# Patient Record
Sex: Male | Born: 1940 | Marital: Married | Smoking: Never smoker
Health system: Southern US, Community
[De-identification: ages and names within clinical notes are randomized; demographics above are authoritative.]

## PROBLEM LIST (undated history)

## (undated) DIAGNOSIS — I482 Chronic atrial fibrillation, unspecified: Secondary | ICD-10-CM

## (undated) DIAGNOSIS — N400 Enlarged prostate without lower urinary tract symptoms: Secondary | ICD-10-CM

## (undated) DIAGNOSIS — I499 Cardiac arrhythmia, unspecified: Secondary | ICD-10-CM

## (undated) DIAGNOSIS — N289 Disorder of kidney and ureter, unspecified: Secondary | ICD-10-CM

## (undated) HISTORY — DX: Disorder of kidney and ureter, unspecified: N28.9

## (undated) HISTORY — DX: Benign prostatic hyperplasia without lower urinary tract symptoms: N40.0

## (undated) HISTORY — DX: Chronic atrial fibrillation, unspecified: I48.20

## (undated) HISTORY — PX: TONSILLECTOMY: SUR1361

## (undated) HISTORY — DX: Gilbert syndrome: E80.4

---

## 2015-12-19 DIAGNOSIS — I482 Chronic atrial fibrillation, unspecified: Secondary | ICD-10-CM | POA: Insufficient documentation

## 2015-12-19 DIAGNOSIS — R3912 Poor urinary stream: Secondary | ICD-10-CM | POA: Insufficient documentation

## 2015-12-19 DIAGNOSIS — E7800 Pure hypercholesterolemia, unspecified: Secondary | ICD-10-CM | POA: Insufficient documentation

## 2015-12-19 DIAGNOSIS — N401 Enlarged prostate with lower urinary tract symptoms: Secondary | ICD-10-CM | POA: Insufficient documentation

## 2016-06-17 DIAGNOSIS — Z0000 Encounter for general adult medical examination without abnormal findings: Secondary | ICD-10-CM | POA: Insufficient documentation

## 2016-06-17 DIAGNOSIS — Z7185 Encounter for immunization safety counseling: Secondary | ICD-10-CM | POA: Insufficient documentation

## 2018-01-14 DIAGNOSIS — E6609 Other obesity due to excess calories: Secondary | ICD-10-CM | POA: Insufficient documentation

## 2018-01-14 DIAGNOSIS — E66811 Obesity, class 1: Secondary | ICD-10-CM | POA: Insufficient documentation

## 2019-07-18 DIAGNOSIS — Z86010 Personal history of colonic polyps: Secondary | ICD-10-CM | POA: Insufficient documentation

## 2019-07-18 DIAGNOSIS — N289 Disorder of kidney and ureter, unspecified: Secondary | ICD-10-CM | POA: Insufficient documentation

## 2019-07-22 ENCOUNTER — Encounter: Payer: Self-pay | Admitting: Urology

## 2019-08-17 ENCOUNTER — Other Ambulatory Visit: Payer: Self-pay

## 2019-08-17 ENCOUNTER — Encounter: Payer: Self-pay | Admitting: Urology

## 2019-08-17 ENCOUNTER — Ambulatory Visit (INDEPENDENT_AMBULATORY_CARE_PROVIDER_SITE_OTHER): Payer: Medicare Other | Admitting: Urology

## 2019-08-17 VITALS — BP 132/83 | HR 92 | Ht 70.0 in | Wt 218.4 lb

## 2019-08-17 DIAGNOSIS — N401 Enlarged prostate with lower urinary tract symptoms: Secondary | ICD-10-CM | POA: Diagnosis not present

## 2019-08-17 DIAGNOSIS — R3912 Poor urinary stream: Secondary | ICD-10-CM | POA: Diagnosis not present

## 2019-08-17 LAB — BLADDER SCAN AMB NON-IMAGING

## 2019-08-17 NOTE — Patient Instructions (Signed)

## 2019-08-17 NOTE — Progress Notes (Signed)
   08/17/19 12:57 PM   Hunter Klein September 05, 1940 [id number]  CC: BPH  HPI: I saw Hunter Klein in urology clinic today for BPH.  Is a 79 year old male moved to the area a few years ago from [place].  He previously followed with a urologist yearly there and has been on finasteride long-term.  PSAs were reportedly always normal and screening was discontinued per AUA guideline recommendations.  His past medical history is notable for atrial fibrillation anticoagulated with Coumadin, congestive heart failure, and mild CKD.  He really denies any urinary complaints today aside from some occasional weak stream.  He denies any incontinence, urgency, or frequency.  He has nocturia 0-1 time per night.  IPSS score is 5, indicating mild symptoms with quality of life pleased.  PVR is normal at 0 mL.  He denies any flank pain or gross hematuria, no prior UTIs or episodes of urinary retention.   PMH: Past Medical History:  Diagnosis Date  . Atrial fibrillation, chronic ([place])   . BPH (benign prostatic hyperplasia)   . Gilbert syndrome   . Renal insufficiency     Social History:  reports that he does not use drugs. No history on file for tobacco and alcohol.  Physical Exam: BP 132/83 (BP Location: Left Arm, Patient Position: Sitting, Cuff Size: Normal)   Pulse 92   Ht 5\' 10"  (1.778 m)   Wt 218 lb 6.4 oz (99.1 kg)   BMI 31.34 kg/m    Constitutional:  Alert and oriented, No acute distress. Cardiovascular: No clubbing, cyanosis, or edema. Respiratory: Normal respiratory effort, no increased work of breathing. GI: Abdomen is soft, nontender, nondistended, no abdominal masses  Laboratory Data: Reviewed  Pertinent Imaging: None to review  Assessment & Plan:   In summary, is a 79 year old male with very mild urinary symptoms who has been on finasteride long-term for unclear indications.  He really has no urinary complaints today, and is emptying his bladder well with a PVR of 0 mL.  I  recommended considering a trial off the finasteride to see if his symptoms worsen.  He could always resume this medication in the future if he felt like his stream decreased.  He is amenable to this as he would like to take less medications if possible.  We discussed return precautions at length including gross hematuria, UTI, or worsening urinary symptoms.  We also reviewed the AUA guidelines that do not recommend routine screening in men over age 46.  Discontinue finasteride RTC 1 year for symptom check  Hunter Madrid, MD 08/17/2019  [facility] [address] [place], [place] [address] [phone]

## 2019-08-18 LAB — URINALYSIS, COMPLETE
Bilirubin, UA: NEGATIVE
Glucose, UA: NEGATIVE
Ketones, UA: NEGATIVE
Leukocytes,UA: NEGATIVE
Nitrite, UA: NEGATIVE
Protein,UA: NEGATIVE
Specific Gravity, UA: >1.03 — ABNORMAL HIGH (ref 1.005–1.030)
Urobilinogen, Ur: 0.2 mg/dL (ref 0.2–1.0)
pH, UA: 5.5 (ref 5.0–7.5)

## 2019-08-18 LAB — MICROSCOPIC EXAMINATION: Bacteria, UA: NONE SEEN

## 2020-01-09 ENCOUNTER — Other Ambulatory Visit: Payer: Self-pay

## 2020-01-09 ENCOUNTER — Other Ambulatory Visit
Admission: RE | Admit: 2020-01-09 | Discharge: 2020-01-09 | Disposition: A | Discharge status: Home / Self Care | Payer: Medicare Other | Source: Ambulatory Visit | Attending: Internal Medicine | Admitting: Internal Medicine

## 2020-01-09 DIAGNOSIS — Z01818 Encounter for other preprocedural examination: Secondary | ICD-10-CM | POA: Diagnosis present

## 2020-01-09 DIAGNOSIS — Z20822 Contact with and (suspected) exposure to covid-19: Secondary | ICD-10-CM | POA: Diagnosis not present

## 2020-01-09 LAB — SARS CORONAVIRUS 2 (TAT 6-24 HRS): SARS Coronavirus 2: NEGATIVE

## 2020-01-10 ENCOUNTER — Encounter: Payer: Self-pay | Admitting: Internal Medicine

## 2020-01-11 ENCOUNTER — Ambulatory Visit
Admission: RE | Admit: 2020-01-11 | Discharge: 2020-01-11 | Disposition: A | Discharge status: Home / Self Care | Payer: Medicare Other | Attending: Internal Medicine | Admitting: Internal Medicine

## 2020-01-11 ENCOUNTER — Encounter: Payer: Self-pay | Admitting: Internal Medicine

## 2020-01-11 ENCOUNTER — Encounter
Admission: RE | Disposition: A | Discharge status: Home / Self Care | Payer: Self-pay | Source: Home / Self Care | Attending: Internal Medicine

## 2020-01-11 ENCOUNTER — Ambulatory Visit: Payer: Medicare Other | Admitting: Anesthesiology

## 2020-01-11 DIAGNOSIS — D123 Benign neoplasm of transverse colon: Secondary | ICD-10-CM | POA: Diagnosis not present

## 2020-01-11 DIAGNOSIS — K641 Second degree hemorrhoids: Secondary | ICD-10-CM | POA: Insufficient documentation

## 2020-01-11 DIAGNOSIS — K5730 Diverticulosis of large intestine without perforation or abscess without bleeding: Secondary | ICD-10-CM | POA: Insufficient documentation

## 2020-01-11 DIAGNOSIS — Z86010 Personal history of colonic polyps: Secondary | ICD-10-CM | POA: Insufficient documentation

## 2020-01-11 DIAGNOSIS — I482 Chronic atrial fibrillation, unspecified: Secondary | ICD-10-CM | POA: Diagnosis not present

## 2020-01-11 DIAGNOSIS — Z79899 Other long term (current) drug therapy: Secondary | ICD-10-CM | POA: Insufficient documentation

## 2020-01-11 DIAGNOSIS — N289 Disorder of kidney and ureter, unspecified: Secondary | ICD-10-CM | POA: Diagnosis not present

## 2020-01-11 DIAGNOSIS — Z7901 Long term (current) use of anticoagulants: Secondary | ICD-10-CM | POA: Diagnosis not present

## 2020-01-11 DIAGNOSIS — D120 Benign neoplasm of cecum: Secondary | ICD-10-CM | POA: Diagnosis not present

## 2020-01-11 DIAGNOSIS — N400 Enlarged prostate without lower urinary tract symptoms: Secondary | ICD-10-CM | POA: Insufficient documentation

## 2020-01-11 DIAGNOSIS — Z1211 Encounter for screening for malignant neoplasm of colon: Secondary | ICD-10-CM | POA: Diagnosis not present

## 2020-01-11 HISTORY — DX: Cardiac arrhythmia, unspecified: I49.9

## 2020-01-11 HISTORY — PX: COLONOSCOPY: SHX5424

## 2020-01-11 SURGERY — COLONOSCOPY
Anesthesia: General

## 2020-01-11 MED ORDER — PHENYLEPHRINE HCL (PRESSORS) 10 MG/ML IV SOLN
INTRAVENOUS | Status: DC | PRN
  Administered 2020-01-11 (×5): 100 ug via INTRAVENOUS

## 2020-01-11 MED ORDER — PROPOFOL 500 MG/50ML IV EMUL
INTRAVENOUS | Status: DC | PRN
  Administered 2020-01-11: 180 ug/kg/min via INTRAVENOUS

## 2020-01-11 MED ORDER — SODIUM CHLORIDE 0.9 % IV SOLN: INTRAVENOUS | Status: DC

## 2020-01-11 MED ORDER — PROPOFOL 10 MG/ML IV BOLUS
INTRAVENOUS | Status: DC | PRN
  Administered 2020-01-11: 70 mg via INTRAVENOUS

## 2020-01-11 NOTE — Transfer of Care (Signed)
Immediate Anesthesia Transfer of Care Note  Patient: Hunter Klein  Procedure(s) Performed: COLONOSCOPY (N/A )  Patient Location: PACU  Anesthesia Type:General  Level of Consciousness: sedated  Airway & Oxygen Therapy: Patient Spontanous Breathing and Patient connected to nasal cannula oxygen  Post-op Assessment: Report given to RN and Post -op Vital signs reviewed and stable  Post vital signs: Reviewed and stable  Last Vitals:  Vitals Value Taken Time  BP 90/55 01/11/20 1420  Temp 36.6 C 01/11/20 1414  Pulse 82 01/11/20 1421  Resp 15 01/11/20 1421  SpO2 95 % 01/11/20 1421  Vitals shown include unvalidated device data.  Last Pain:  Vitals:   01/11/20 1414  TempSrc: Temporal  PainSc: Asleep         Complications: No complications documented.

## 2020-01-11 NOTE — H&P (Signed)
Outpatient short stay form Pre-procedure 01/11/2020 1:30 PM Hunter Klein K. Alice Reichert, M.D.  Primary Physician: Dion Body, M.D.  Reason for visit:  Personal history of tubular adenomatous colon polyps [place], [place] c. 2016).  History of present illness:                            Patient presents for colonoscopy for a personal hx of colon polyps. The patient denies abdominal pain, abnormal weight loss or rectal bleeding.  Patient takes coumadin for atrial fibrillation but has held that 5 or more days.    Current Facility-Administered Medications:  .  0.9 %  sodium chloride infusion, , Intravenous, Continuous, [place], Benay Pike, MD, Last Rate: 20 mL/hr at 01/11/20 1249, New Bag at 01/11/20 1249  Medications Prior to Admission  Medication Sig Dispense Refill Last Dose  . guaiFENesin (MUCINEX) 600 MG 12 hr tablet Take by mouth 2 (two) times daily.     Marland Kitchen lisinopril (ZESTRIL) 2.5 MG tablet Take 2.5 mg by mouth at bedtime.   01/10/2020 at Unknown time  . metoprolol succinate (TOPROL-XL) 50 MG 24 hr tablet Take 50 mg by mouth daily.   01/10/2020 at Unknown time  . latanoprost (XALATAN) 0.005 % ophthalmic solution Place 1 drop into the right eye at bedtime.     Marland Kitchen loratadine (CLARITIN) 10 MG tablet Take by mouth.     . simvastatin (ZOCOR) 40 MG tablet Take 40 mg by mouth at bedtime.     Marland Kitchen warfarin (COUMADIN) 2 MG tablet Take 3 mg by mouth daily.   12/30/2019     No Known Allergies   Past Medical History:  Diagnosis Date  . Atrial fibrillation, chronic ([employer] [employer])   . BPH (benign prostatic hyperplasia)   . Dysrhythmia    Afib  . Gilbert syndrome   . Renal insufficiency     Review of systems:  Otherwise negative.    Physical Exam  Gen: Alert, oriented. Appears stated age.  HEENT: [place]/AT. PERRLA. Lungs: CTA, no wheezes. CV: RR nl S1, S2. Abd: soft, benign, no masses. BS+ Ext: No edema. Pulses 2+    Planned procedures: Proceed with colonoscopy. The patient understands the nature of  the planned procedure, indications, risks, alternatives and potential complications including but not limited to bleeding, infection, perforation, damage to internal organs and possible oversedation/side effects from anesthesia. The patient agrees and gives consent to proceed.  Please refer to procedure notes for findings, recommendations and patient disposition/instructions.     Karas Pickerill K. Alice Reichert, M.D. Gastroenterology 01/11/2020  1:30 PM

## 2020-01-11 NOTE — Interval H&P Note (Signed)
History and Physical Interval Note:  01/11/2020 1:32 PM  Hunter Klein  has presented today for surgery, with the diagnosis of PERSONAL HX.OF COLON POLYPS.  The various methods of treatment have been discussed with the patient and family. After consideration of risks, benefits and other options for treatment, the patient has consented to  Procedure(s): COLONOSCOPY (N/A) as a surgical intervention.  The patient's history has been reviewed, patient examined, no change in status, stable for surgery.  I have reviewed the patient's chart and labs.  Questions were answered to the patient's satisfaction.     [place], [place]

## 2020-01-11 NOTE — Anesthesia Procedure Notes (Signed)
Date/Time: 01/11/2020 2:01 PM Performed by: Nelda Marseille, CRNA Pre-anesthesia Checklist: Patient identified, Emergency Drugs available, Suction available, Patient being monitored and Timeout performed Oxygen Delivery Method: Nasal cannula

## 2020-01-11 NOTE — Op Note (Addendum)
[facility] Gastroenterology Patient Name: Alan Drummer Procedure Date: 01/11/2020 1:09 PM MRN: [id number] Account #: [id number] Date of Birth: 11-11-1940 Admit Type: Outpatient Age: 79 Room: [facility] ENDO ROOM 3 Gender: Male Note Status: Finalized Procedure:             Colonoscopy Indications:           Surveillance: Personal history of adenomatous polyps                         on last colonoscopy > 5 years ago Providers:             Lorie Apley K. Alice Reichert MD, MD Referring MD:          Dion Body (Referring MD) Medicines:             Propofol per Anesthesia Complications:         No immediate complications. Procedure:             Pre-Anesthesia Assessment:                        - The risks and benefits of the procedure and the                         sedation options and risks were discussed with the                         patient. All questions were answered and informed                         consent was obtained.                        - Patient identification and proposed procedure were                         verified prior to the procedure by the nurse. The                         procedure was verified in the procedure room.                        - ASA Grade Assessment: III - A patient with severe                         systemic disease.                        - After reviewing the risks and benefits, the patient                         was deemed in satisfactory condition to undergo the                         procedure.                        After obtaining informed consent, the colonoscope was                         passed under direct vision. Throughout  the procedure,                         the patient's blood pressure, pulse, and oxygen                         saturations were monitored continuously. The                         Colonoscope was introduced through the anus and                         advanced to the the cecum, identified by  appendiceal                         orifice and ileocecal valve. The colonoscopy was                         performed without difficulty. The patient tolerated                         the procedure well. The quality of the bowel                         preparation was good. The ileocecal valve, appendiceal                         orifice, and rectum were photographed. Findings:      The perianal and digital rectal examinations were normal. Pertinent       negatives include normal sphincter tone and no palpable rectal lesions.      Many small-mouthed diverticula were found in the sigmoid colon.      Two sessile polyps were found in the transverse colon and cecum. The       polyps were 4 to 5 mm in size. These polyps were removed with a jumbo       cold forceps. Resection and retrieval were complete.      Non-bleeding internal hemorrhoids were found during retroflexion. The       hemorrhoids were Grade II (internal hemorrhoids that prolapse but reduce       spontaneously).      The exam was otherwise without abnormality. Impression:            - Diverticulosis in the sigmoid colon.                        - Two 4 to 5 mm polyps in the transverse colon and in                         the cecum, removed with a jumbo cold forceps. Resected                         and retrieved.                        - Non-bleeding internal hemorrhoids.                        - The examination was otherwise normal. Recommendation:        -  Patient has a contact number available for                         emergencies. The signs and symptoms of potential                         delayed complications were discussed with the patient.                         Return to normal activities tomorrow. Written                         discharge instructions were provided to the patient.                        - Resume previous diet.                        - Continue present medications.                        - Await  pathology results.                        - If polyps are benign or adenomatous without                         dysplasia, I will advise NO further colonoscopy due to                         advanced age and/or severe comorbidity.                        - Resume Coumadin (warfarin) at prior dose today.                         Refer to managing physician for further adjustment of                         therapy.                        - Return to GI office PRN.                        - The findings and recommendations were discussed with                         the patient. Procedure Code(s):     --- Professional ---                        [phone], Colonoscopy, flexible; with biopsy, single or                         multiple Diagnosis Code(s):     --- Professional ---                        K57.30, Diverticulosis of large intestine without  perforation or abscess without bleeding                        K63.5, Polyp of colon                        K64.1, Second degree hemorrhoids                        Z86.010, Personal history of colonic polyps CPT copyright 2019 American Medical Association. All rights reserved. The codes documented in this report are preliminary and upon coder review may  be revised to meet current compliance requirements. Efrain Sella MD, MD 01/11/2020 2:17:22 PM This report has been signed electronically. Number of Addenda: 0 Note Initiated On: 01/11/2020 1:09 PM Scope Withdrawal Time: 0 hours 5 minutes 33 seconds  Total Procedure Duration: 0 hours 12 minutes 8 seconds  Estimated Blood Loss:  Estimated blood loss: none.      [facility]

## 2020-01-11 NOTE — Anesthesia Preprocedure Evaluation (Signed)
Anesthesia Evaluation  Patient identified by MRN, date of birth, ID band Patient awake    Reviewed: Allergy & Precautions, NPO status , Patient's Chart, lab work & pertinent test results  History of Anesthesia Complications Negative for: history of anesthetic complications  Airway Mallampati: II       Dental  (+) Upper Dentures   Pulmonary neg sleep apnea, neg COPD, Not current smoker,           Cardiovascular (-) hypertension(-) Past MI and (-) CHF + dysrhythmias Atrial Fibrillation      Neuro/Psych neg Seizures    GI/Hepatic Neg liver ROS, neg GERD  ,  Endo/Other  neg diabetes  Renal/GU Renal InsufficiencyRenal disease     Musculoskeletal   Abdominal   Peds  Hematology   Anesthesia Other Findings   Reproductive/Obstetrics                             Anesthesia Physical Anesthesia Plan  ASA: III  Anesthesia Plan: General   Post-op Pain Management:    Induction: Intravenous  PONV Risk Score and Plan: 2 and Propofol infusion and TIVA  Airway Management Planned: Nasal Cannula  Additional Equipment:   Intra-op Plan:   Post-operative Plan:   Informed Consent: I have reviewed the patients History and Physical, chart, labs and discussed the procedure including the risks, benefits and alternatives for the proposed anesthesia with the patient or authorized representative who has indicated his/her understanding and acceptance.       Plan Discussed with:   Anesthesia Plan Comments:         Anesthesia Quick Evaluation

## 2020-01-11 NOTE — Anesthesia Postprocedure Evaluation (Signed)
Anesthesia Post Note  Patient: Hunter Klein  Procedure(s) Performed: COLONOSCOPY (N/A )  Patient location during evaluation: Endoscopy Anesthesia Type: General Level of consciousness: awake and alert Pain management: pain level controlled Vital Signs Assessment: post-procedure vital signs reviewed and stable Respiratory status: spontaneous breathing and respiratory function stable Cardiovascular status: stable Anesthetic complications: no   No complications documented.   Last Vitals:  Vitals:   01/11/20 1414 01/11/20 1419  BP: (!) 92/49 (!) 90/55  Pulse: 78 82  Resp: 15 15  Temp: 36.6 C   SpO2: 100% 96%    Last Pain:  Vitals:   01/11/20 1414  TempSrc: Temporal  PainSc: Asleep                 Robert Sunga K

## 2020-01-12 ENCOUNTER — Encounter: Payer: Self-pay | Admitting: Internal Medicine

## 2020-01-13 LAB — SURGICAL PATHOLOGY

## 2020-01-23 DIAGNOSIS — Z87448 Personal history of other diseases of urinary system: Secondary | ICD-10-CM | POA: Insufficient documentation

## 2020-08-17 ENCOUNTER — Ambulatory Visit: Payer: Self-pay | Admitting: Urology

## 2020-08-22 ENCOUNTER — Ambulatory Visit: Payer: Medicare Other | Admitting: Urology

## 2020-09-06 ENCOUNTER — Ambulatory Visit (INDEPENDENT_AMBULATORY_CARE_PROVIDER_SITE_OTHER): Payer: Medicare Other | Admitting: Urology

## 2020-09-06 ENCOUNTER — Other Ambulatory Visit: Payer: Self-pay

## 2020-09-06 ENCOUNTER — Encounter: Payer: Self-pay | Admitting: Urology

## 2020-09-06 VITALS — BP 125/76 | HR 80 | Ht 69.5 in | Wt 218.0 lb

## 2020-09-06 DIAGNOSIS — R3912 Poor urinary stream: Secondary | ICD-10-CM

## 2020-09-06 DIAGNOSIS — N401 Enlarged prostate with lower urinary tract symptoms: Secondary | ICD-10-CM | POA: Diagnosis not present

## 2020-09-06 LAB — URINALYSIS, COMPLETE
Bilirubin, UA: NEGATIVE
Glucose, UA: NEGATIVE
Ketones, UA: NEGATIVE
Leukocytes,UA: NEGATIVE
Nitrite, UA: NEGATIVE
Protein,UA: NEGATIVE
Specific Gravity, UA: 1.025 (ref 1.005–1.030)
Urobilinogen, Ur: 0.2 mg/dL (ref 0.2–1.0)
pH, UA: 5 (ref 5.0–7.5)

## 2020-09-06 LAB — BLADDER SCAN AMB NON-IMAGING

## 2020-09-06 LAB — MICROSCOPIC EXAMINATION: Bacteria, UA: NONE SEEN

## 2020-09-06 NOTE — Progress Notes (Signed)
   09/06/2020 8:55 AM   Hunter Klein Jan 08, 1941 [id number]  Reason for visit: Follow up BPH, discuss PSA screening  HPI: 79 year old male who I originally saw in May 2021 after he moved to the area from [place].  Was previously followed by a urologist yearly there and PSAs were reportedly normal and PSA screening was discontinued per the AUA guideline recommendations.  He has been on finasteride long-term for unclear reasons, and he was having minimal to no urinary symptoms at our last visit and we opted to stop the finasteride and see how his urinary symptoms responded.  He has done very well since that time with no changes in his urinary symptoms.  He denies any urinary complaints, gross hematuria, or UTIs.  I think he can follow-up on an as-needed basis.  We discussed return precautions including worsening urinary symptoms, gross hematuria, urinary retention, or UTIs.  We also again reviewed the AUA guidelines that do not recommend routine screening in men over age 77.  Follow-up with urology as needed   [place], [place] [address], [place], [place] [address] [phone]

## 2021-01-21 ENCOUNTER — Observation Stay
Admission: EM | Admit: 2021-01-21 | Discharge: 2021-01-22 | Disposition: A | Discharge status: Home / Self Care | Payer: Medicare Other | Attending: Internal Medicine | Admitting: Internal Medicine

## 2021-01-21 ENCOUNTER — Emergency Department: Payer: Medicare Other

## 2021-01-21 DIAGNOSIS — R791 Abnormal coagulation profile: Secondary | ICD-10-CM

## 2021-01-21 DIAGNOSIS — Z6831 Body mass index (BMI) 31.0-31.9, adult: Secondary | ICD-10-CM | POA: Diagnosis not present

## 2021-01-21 DIAGNOSIS — R739 Hyperglycemia, unspecified: Secondary | ICD-10-CM | POA: Diagnosis not present

## 2021-01-21 DIAGNOSIS — R4789 Other speech disturbances: Secondary | ICD-10-CM | POA: Diagnosis present

## 2021-01-21 DIAGNOSIS — I639 Cerebral infarction, unspecified: Secondary | ICD-10-CM | POA: Diagnosis not present

## 2021-01-21 DIAGNOSIS — E785 Hyperlipidemia, unspecified: Secondary | ICD-10-CM | POA: Diagnosis not present

## 2021-01-21 DIAGNOSIS — M6281 Muscle weakness (generalized): Secondary | ICD-10-CM | POA: Diagnosis not present

## 2021-01-21 DIAGNOSIS — N401 Enlarged prostate with lower urinary tract symptoms: Secondary | ICD-10-CM | POA: Insufficient documentation

## 2021-01-21 DIAGNOSIS — I482 Chronic atrial fibrillation, unspecified: Secondary | ICD-10-CM | POA: Diagnosis not present

## 2021-01-21 DIAGNOSIS — Z20822 Contact with and (suspected) exposure to covid-19: Secondary | ICD-10-CM | POA: Insufficient documentation

## 2021-01-21 DIAGNOSIS — Z7901 Long term (current) use of anticoagulants: Secondary | ICD-10-CM | POA: Diagnosis not present

## 2021-01-21 DIAGNOSIS — N182 Chronic kidney disease, stage 2 (mild): Secondary | ICD-10-CM | POA: Diagnosis not present

## 2021-01-21 DIAGNOSIS — I63512 Cerebral infarction due to unspecified occlusion or stenosis of left middle cerebral artery: Secondary | ICD-10-CM | POA: Diagnosis not present

## 2021-01-21 DIAGNOSIS — I6782 Cerebral ischemia: Secondary | ICD-10-CM | POA: Diagnosis not present

## 2021-01-21 DIAGNOSIS — R29810 Facial weakness: Secondary | ICD-10-CM | POA: Diagnosis present

## 2021-01-21 DIAGNOSIS — E669 Obesity, unspecified: Secondary | ICD-10-CM | POA: Insufficient documentation

## 2021-01-21 LAB — COMPREHENSIVE METABOLIC PANEL
ALT: 14 U/L (ref 0–44)
AST: 22 U/L (ref 15–41)
Albumin: 4.2 g/dL (ref 3.5–5.0)
Alkaline Phosphatase: 79 U/L (ref 38–126)
Anion gap: 6 (ref 5–15)
BUN: 23 mg/dL (ref 8–23)
CO2: 29 mmol/L (ref 22–32)
Calcium: 9.1 mg/dL (ref 8.9–10.3)
Chloride: 106 mmol/L (ref 98–111)
Creatinine, Ser: 1.04 mg/dL (ref 0.61–1.24)
GFR, Estimated: >60 mL/min (ref >60)
Glucose, Bld: 109 mg/dL — ABNORMAL HIGH (ref 70–99)
Potassium: 4.2 mmol/L (ref 3.5–5.1)
Sodium: 141 mmol/L (ref 135–145)
Total Bilirubin: 2.4 mg/dL — ABNORMAL HIGH (ref 0.3–1.2)
Total Protein: 7.3 g/dL (ref 6.5–8.1)

## 2021-01-21 LAB — CBC
HCT: 48.4 % (ref 39.0–52.0)
Hemoglobin: 16.4 g/dL (ref 13.0–17.0)
MCH: 32.9 pg (ref 26.0–34.0)
MCHC: 33.9 g/dL (ref 30.0–36.0)
MCV: 97 fL (ref 80.0–100.0)
Platelets: 182 10*3/uL (ref 150–400)
RBC: 4.99 MIL/uL (ref 4.22–5.81)
RDW: 13.1 % (ref 11.5–15.5)
WBC: 6.9 10*3/uL (ref 4.0–10.5)
nRBC: 0 % (ref 0.0–0.2)

## 2021-01-21 LAB — DIFFERENTIAL
Abs Immature Granulocytes: 0.02 10*3/uL (ref 0.00–0.07)
Basophils Absolute: 0 10*3/uL (ref 0.0–0.1)
Basophils Relative: 0 %
Eosinophils Absolute: 0.1 10*3/uL (ref 0.0–0.5)
Eosinophils Relative: 1 %
Immature Granulocytes: 0 %
Lymphocytes Relative: 21 %
Lymphs Abs: 1.4 10*3/uL (ref 0.7–4.0)
Monocytes Absolute: 0.5 10*3/uL (ref 0.1–1.0)
Monocytes Relative: 7 %
Neutro Abs: 4.9 10*3/uL (ref 1.7–7.7)
Neutrophils Relative %: 71 %

## 2021-01-21 LAB — PROTIME-INR
INR: 1.5 — ABNORMAL HIGH (ref 0.8–1.2)
Prothrombin Time: 17.8 seconds — ABNORMAL HIGH (ref 11.4–15.2)

## 2021-01-21 LAB — CBG MONITORING, ED: Glucose-Capillary: 136 mg/dL — ABNORMAL HIGH (ref 70–99)

## 2021-01-21 LAB — APTT: aPTT: 32 seconds (ref 24–36)

## 2021-01-21 MED ORDER — ACETAMINOPHEN 160 MG/5ML PO SOLN
650.0000 mg | ORAL | Status: DC | PRN
  Filled 2021-01-21: qty 20.3

## 2021-01-21 MED ORDER — WARFARIN - PHARMACIST DOSING INPATIENT
Freq: Every day | Status: DC
  Filled 2021-01-21: qty 1

## 2021-01-21 MED ORDER — ACETAMINOPHEN 650 MG RE SUPP: 650.0000 mg | RECTAL | Status: DC | PRN

## 2021-01-21 MED ORDER — WARFARIN SODIUM 5 MG PO TABS
5.0000 mg | ORAL_TABLET | Freq: Once | ORAL | Status: DC
  Filled 2021-01-21: qty 1

## 2021-01-21 MED ORDER — ACETAMINOPHEN 325 MG PO TABS: 650.0000 mg | ORAL_TABLET | Freq: Four times a day (QID) | ORAL | Status: DC | PRN

## 2021-01-21 MED ORDER — ACETAMINOPHEN 325 MG PO TABS: 650.0000 mg | ORAL_TABLET | ORAL | Status: DC | PRN

## 2021-01-21 MED ORDER — WARFARIN SODIUM 3 MG PO TABS: 3.0000 mg | ORAL_TABLET | Freq: Every day | ORAL | Status: DC

## 2021-01-21 MED ORDER — LORATADINE 10 MG PO TABS
10.0000 mg | ORAL_TABLET | Freq: Every day | ORAL | Status: DC
  Administered 2021-01-22: 12:00:00 10 mg via ORAL
  Filled 2021-01-21: qty 1

## 2021-01-21 MED ORDER — SODIUM CHLORIDE 0.9% FLUSH
3.0000 mL | Freq: Once | INTRAVENOUS | Status: AC
Start: 2021-01-21 — End: 2021-01-21
  Administered 2021-01-21: 3 mL via INTRAVENOUS

## 2021-01-21 MED ORDER — STROKE: EARLY STAGES OF RECOVERY BOOK: Freq: Once | Status: DC

## 2021-01-21 MED ORDER — SENNOSIDES-DOCUSATE SODIUM 8.6-50 MG PO TABS: 1.0000 | ORAL_TABLET | Freq: Every evening | ORAL | Status: DC | PRN

## 2021-01-21 MED ORDER — WARFARIN SODIUM 5 MG PO TABS
5.0000 mg | ORAL_TABLET | Freq: Once | ORAL | Status: AC
  Administered 2021-01-21: 5 mg via ORAL
  Filled 2021-01-21: qty 1

## 2021-01-21 MED ORDER — SIMVASTATIN 20 MG PO TABS
40.0000 mg | ORAL_TABLET | Freq: Every day | ORAL | Status: DC
  Administered 2021-01-21: 40 mg via ORAL
  Filled 2021-01-21: qty 4

## 2021-01-21 NOTE — ED Notes (Signed)
Called activated code stroke to [facility]

## 2021-01-21 NOTE — ED Notes (Signed)
Returns from [facility] with this RN

## 2021-01-21 NOTE — ED Triage Notes (Addendum)
Pt comes via POV with c/o stroke. Wife reports 10 minutes ago pt began slurring speech and unable to get words out. Wife sates he wasn't making sense.  Pt has left sided facial droop. Wife reports pt wasn't able to swallow drink   CODE STROKE called, Charge RN aware and Secretary called.

## 2021-01-21 NOTE — H&P (Signed)
Chief Complaint: Left facial droop. Patient came from home, he is independent all ADLs at baseline. HPI:  Hunter Klein is a 80 year old male with history of chron atrial fibrillation, benign prostate hypertrophy, Gilbert syndrome, chronic kidney disease stage II.  Patient came in to the hospital complaining of left facial droop, slurred speech. Patient was in his normal state of health until 2 PM today.  Woke up from the nap, feeling left-sided facial droop.  He also had slurred speech per wife.  He is right-hand feels funny, but does not have significant weakness.  He was still able to walk, he does not have headache or dizziness. Currently, he still has some slurred speech, facial droop has resolved. Upon arriving the emergency room, he had a CT head, showed a hyperdensity in the MCA distribution.  Not able to rule out artifact.  Patient has been seen by neurology, MRI brain, MR angiogram neck and head, echocardiogram ordered.  Past Medical History:  Diagnosis Date   Atrial fibrillation, chronic (HCC)    BPH (benign prostatic hyperplasia)    Dysrhythmia    Afib   Gilbert syndrome    Renal insufficiency     Past Surgical History:  Procedure Laterality Date   COLONOSCOPY N/A 01/11/2020   Procedure: COLONOSCOPY;  Surgeon: Toledo, Benay Pike, MD;  Location: [facility] ENDOSCOPY;  Service: Gastroenterology;  Laterality: N/A;   TONSILLECTOMY      No family history on file. Social History:  reports that he has never smoked. He has never used smokeless tobacco. He reports that he does not currently use alcohol. He reports that he does not use drugs.  Allergies: No Known Allergies  (Not in a hospital admission)   Results for orders placed or performed during the hospital encounter of 01/21/21 (from the past 48 hour(s))  CBG monitoring, ED     Status: Abnormal   Collection Time: 01/21/21  2:58 PM  Result Value Ref Range   Glucose-Capillary 136 (H) 70 - 99 mg/dL    Comment: Glucose  reference range applies only to samples taken after fasting for at least 8 hours.  APTT     Status: None   Collection Time: 01/21/21  3:15 PM  Result Value Ref Range   aPTT 32 24 - 36 seconds    Comment: Performed at [facility], [address] [place], [place] [address]  CBC     Status: None   Collection Time: 01/21/21  3:15 PM  Result Value Ref Range   WBC 6.9 4.0 - 10.5 K/uL   RBC 4.99 4.22 - 5.81 MIL/uL   Hemoglobin 16.4 13.0 - 17.0 g/dL   HCT 48.4 39.0 - 52.0 %   MCV 97.0 80.0 - 100.0 fL   MCH 32.9 26.0 - 34.0 pg   MCHC 33.9 30.0 - 36.0 g/dL   RDW 13.1 11.5 - 15.5 %   Platelets 182 150 - 400 K/uL   nRBC 0.0 0.0 - 0.2 %    Comment: Performed at [facility], [address] [place], [place] [address]  Differential     Status: None   Collection Time: 01/21/21  3:15 PM  Result Value Ref Range   Neutrophils Relative % 71 %   Neutro Abs 4.9 1.7 - 7.7 K/uL   Lymphocytes Relative 21 %   Lymphs Abs 1.4 0.7 - 4.0 K/uL   Monocytes Relative 7 %   Monocytes Absolute 0.5 0.1 - 1.0 K/uL   Eosinophils Relative 1 %   Eosinophils Absolute  0.1 0.0 - 0.5 K/uL   Basophils Relative 0 %   Basophils Absolute 0.0 0.0 - 0.1 K/uL   Immature Granulocytes 0 %   Abs Immature Granulocytes 0.02 0.00 - 0.07 K/uL    Comment: Performed at [facility], [address] [place], [place] [address]  Comprehensive metabolic panel     Status: Abnormal   Collection Time: 01/21/21  3:15 PM  Result Value Ref Range   Sodium 141 135 - 145 mmol/L   Potassium 4.2 3.5 - 5.1 mmol/L   Chloride 106 98 - 111 mmol/L   CO2 29 22 - 32 mmol/L   Glucose, Bld 109 (H) 70 - 99 mg/dL    Comment: Glucose reference range applies only to samples taken after fasting for at least 8 hours.   BUN 23 8 - 23 mg/dL   Creatinine, Ser 1.04 0.61 - 1.24 mg/dL   Calcium 9.1 8.9 - 10.3 mg/dL   Total Protein 7.3 6.5 - 8.1 g/dL   Albumin 4.2 3.5 - 5.0 g/dL   AST 22 15 - 41 U/L   ALT 14 0 - 44 U/L    Alkaline Phosphatase 79 38 - 126 U/L   Total Bilirubin 2.4 (H) 0.3 - 1.2 mg/dL   GFR, Estimated >60 >60 mL/min    Comment: (NOTE) Calculated using the CKD-EPI Creatinine Equation (2021)    Anion gap 6 5 - 15    Comment: Performed at [facility], [address] [place], [place] [address]  Protime-INR     Status: Abnormal   Collection Time: 01/21/21  3:15 PM  Result Value Ref Range   Prothrombin Time 17.8 (H) 11.4 - 15.2 seconds   INR 1.5 (H) 0.8 - 1.2    Comment: (NOTE) INR goal varies based on device and disease states. Performed at [facility], [address] [place], [place] [address]    CT HEAD CODE STROKE WO CONTRAST  Result Date: 01/21/2021 CLINICAL DATA:  Code stroke.  Neuro deficit, acute, stroke suspected EXAM: CT HEAD WITHOUT CONTRAST TECHNIQUE: Contiguous axial images were obtained from the base of the skull through the vertex without intravenous contrast. COMPARISON:  None. FINDINGS: Brain: No evidence of acute large vascular territory infarction, hemorrhage, hydrocephalus, extra-axial collection or mass lesion/mass effect. Patchy white matter hypoattenuation, nonspecific but compatible with chronic microvascular ischemic disease. Atrophy with ex vacuo ventricular dilation. Vascular: Potential hyperdense right ICA terminus/M1 MCA, although this could be artifatual given streak artifact through this region. Calcific intracranial atherosclerosis. Skull: No acute fracture. Sinuses/Orbits: Visualized sinuses are clear. No acute orbital findings. Other: No mastoid effusions. ASPECTS [facility] Stroke Program Early CT Score) total score (0-10 with 10 being normal): 10 IMPRESSION: 1. Potential hyperdense right ICA terminus/M1 MCA, although this could be artifactual given streak artifact through this region. CTA could further evaluate for large vessel occlusion eft clinically indicated. 2. No evidence of acute large vascular territory infarct or acute hemorrhage.  ASPECTS is 10. Electronically Signed   By: Margaretha Sheffield M.D.   On: 01/21/2021 15:21    Review of Systems  Constitutional:  Negative for activity change, appetite change, diaphoresis, fever and unexpected weight change.  HENT:  Negative for congestion, facial swelling and rhinorrhea.   Eyes:  Negative for photophobia, pain and visual disturbance.  Respiratory:  Negative for cough, choking and shortness of breath.   Cardiovascular:  Negative for chest pain, palpitations and leg swelling.  Gastrointestinal:  Negative for abdominal pain, constipation and diarrhea.  Endocrine: Negative  for cold intolerance and polydipsia.  Genitourinary:  Negative for difficulty urinating, dysuria, flank pain and hematuria.  Musculoskeletal:  Negative for arthralgias and back pain.  Skin:  Negative for color change and pallor.  Allergic/Immunologic: Negative for environmental allergies and food allergies.  Neurological:  Positive for dizziness. Negative for numbness and headaches.  Psychiatric/Behavioral:  Negative for agitation, confusion and hallucinations.    Blood pressure (!) 148/91, pulse 81, temperature 97.8 F (36.6 C), temperature source Oral, resp. rate 19, weight 102 kg, SpO2 100 %. Physical Exam Constitutional:      General: He is not in acute distress. HENT:     Head: Normocephalic and atraumatic.     Nose: No congestion or rhinorrhea.     Mouth/Throat:     Mouth: Mucous membranes are moist.     Pharynx: Oropharynx is clear.  Eyes:     Conjunctiva/sclera: Conjunctivae normal.     Pupils: Pupils are equal, round, and reactive to light.  Cardiovascular:     Rate and Rhythm: Normal rate. Rhythm irregular.     Heart sounds: No murmur heard.   No gallop.  Pulmonary:     Effort: Pulmonary effort is normal.     Breath sounds: Normal breath sounds.  Abdominal:     General: Abdomen is flat. Bowel sounds are normal. There is no distension.     Palpations: Abdomen is soft.     Tenderness:  There is no abdominal tenderness.  Musculoskeletal:        General: No swelling or tenderness. Normal range of motion.     Cervical back: Normal range of motion and neck supple. No rigidity.  Skin:    General: Skin is warm.     Coloration: Skin is not jaundiced.  Neurological:     General: No focal deficit present.     Mental Status: He is alert and oriented to person, place, and time.     Cranial Nerves: No cranial nerve deficit.  Psychiatric:        Mood and Affect: Mood normal.        Behavior: Behavior normal.     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 Patient has been seen by neurology, will obtain full stroke work-up.  MRI of the brain, MR angiogram of the brain and the neck, echocardiogram will be performed.  Lipid panel will be also performed.  Patient be also placed on telemetry. Continue warfarin per pharmacy.  Patient INR is subtherapeutic, this could be the cause of stroke.  Chronic atrial fibrillation. Continue anticoagulation. Currently heart rate under control.  Chronic kidney disease stage II. Renal function stable.   Sharen Hones, MD 01/21/2021, 4:36 PM

## 2021-01-21 NOTE — Progress Notes (Signed)
   01/21/21 1515  Clinical Encounter Type  Visited With Family  Visit Type Initial;Code;Spiritual support;Social support  Spiritual Encounters  Spiritual Needs Other (Comment) (genral support)  Chaplain Burris responded to Code Stroke. Engaged Pt's spouse in room while Pt's initial assessment still underway. [place] offered hospitality and made presence known and offered support as needed. Chaplain left so spouse could focus on assessment outcome; will refer for follow-up.

## 2021-01-21 NOTE — ED Notes (Signed)
Called Spouse" Mrs Erice Ahles [phone]); gave update; relayed to patient about wife calling; patient verbalized not requiring anything for the morning visit

## 2021-01-21 NOTE — ED Notes (Signed)
Pt to MRI on full cardiac monitor

## 2021-01-21 NOTE — Consult Note (Signed)
ANTICOAGULATION CONSULT NOTE - Initial Consult  Pharmacy Consult for warfarin Indication: atrial fibrillation  No Known Allergies  Patient Measurements: Weight: 102 kg (224 lb 13.9 oz)   Vital Signs: Temp: 97.8 F (36.6 C) (10/24 1500) Temp Source: Oral (10/24 1500) BP: 148/91 (10/24 1625) Pulse Rate: 81 (10/24 1625)  Labs: Recent Labs    01/21/21 1515  HGB 16.4  HCT 48.4  PLT 182  APTT 32  LABPROT 17.8*  INR 1.5*  CREATININE 1.04    Estimated Creatinine Clearance: 68.3 mL/min (by C-G formula based on SCr of 1.04 mg/dL).   Medical History: Past Medical History:  Diagnosis Date   Atrial fibrillation, chronic (HCC)    BPH (benign prostatic hyperplasia)    Dysrhythmia    Afib   Gilbert syndrome    Renal insufficiency     Medications:  PTA med: Warfarin 2 mg Tues/Thurs; 3 mg every other day (TWD = 19)  Assessment: 80 year old male with history of chron atrial fibrillation on warfarin regimen as above, presented with left facial droop, slurred speech. No fibrinolytic given. INR subtherapeutic (1.5) on admission. Pharmacy has been consulted to manage warfarin inpatient. No documented new medications started or missed doses  DD interactions: none  Date INR Warfarin Dose  10/23 -- 3 mg (PTA) 10/24 1.5 5 mg  Goal of Therapy:  INR 2-3 Monitor platelets by anticoagulation protocol: Yes   Plan:  INR subtherapeutic 1.5 on admission One time increased dose of 5 mg  Daily INR  Dorothe Pea, PharmD, BCPS Clinical Pharmacist   01/21/2021,4:53 PM

## 2021-01-21 NOTE — Progress Notes (Signed)
Stroke code brief note  80 yo pt with hx valvular a fib on warfarin p/w acute onset R facial droop and dysarthria, both improving but not resolved. LKW 1400 today. NIHSS = 2 for facial droop and dysarthria. Head CT showed possible hyperdense R ICA terminus / M1 MCA but this is favored to be artifactual given streak throughout this region and the fact that his sx localize to L MCA. TNK contraindicated 2/2 patient on warfarin. Exam not c/w LVO therefore CTA H&N not performed emergently as part of stroke code (MRA H&N being performed now). SBP 160s now 130s; unclear if prior readings were accurate but will follow closely.  Interim recommendations: - STAT MRI brain wo contrast to evaluate for stroke and determine if patient can be safely continued on warfarin - MRA H&N - TTE - LDL, A1c - No indication for antiplatelet if warfarin does not need to be held - Permissive HTN x48 hrs from sx onset or until stroke ruled out by MRI goal BP <220/110. PRN labetalol or hydralazine if BP above these parameters. Avoid oral antihypertensives. Hold home metoprolol for now, patient denies hx RVR. - Check A1c and LDL + add statin per guidelines - Cycle BP q 5 min x30 min, if stable may relax to vitals q 2 hrs. If SBP <130, start gentle hydration and page neuro. - q4 hr neuro checks - STAT head CT for any change in neuro exam - Tele - PT/OT/SLP - Stroke education - Amb referral to neurology upon discharge   Full consult note to follow.  Su Monks, MD [facility] [phone]  If 7pm- 7am, please page neurology on call as listed in [place].

## 2021-01-21 NOTE — ED Notes (Signed)
Assisted patient to bathroom; ambulated with independent/ steady gait

## 2021-01-21 NOTE — Code Documentation (Signed)
Stroke Response Nurse Documentation Code Documentation  Hunter Klein is a 80 y.o. male arriving to [facility] ED via [place] on 01/21/2021 with past medical hx of afib. On warfarin daily. Code stroke was activated by Triage RN.   Patient from home where he was LKW at 1400 and now complaining of facial droop and slurred speech.   Stroke team at the bedside on patient arrival. Labs drawn and patient cleared for CT by Dr. Kerman Passey. Patient to CT with team. NIHSS 2, see documentation for details and code stroke times. The following imaging was completed:  CT Head.Patient is not a candidate for IV Thrombolytic due to daily warfarin.  Care/Plan: Pt to have MRI stat and BP monitoring with Q2H NIH.   Bedside handoff with ED RN Bland Span.    Velta Addison Stroke Response RN

## 2021-01-21 NOTE — Consult Note (Signed)
NEUROLOGY CONSULTATION NOTE   Date of service: January 21, 2021 Patient Name: Hunter Klein MRN:  [id number] DOB:  Sep 12, 1940 Reason for consult: stroke code Requesting physician: Dr. Harvest Dark _ _ _   _ __   _ __ _ _  __ __   _ __   __ _  History of Present Illness   80 yo pt with hx valvular a fib on warfarin, Gilbert syndrome, CKD stage II, BPH p/w acute onset R facial droop and dysarthria, both improving but not resolved. LKW 1400 today. Pt noticed R facial droop in mirror and came in next room to talk to his wife who said his speech was unintelligible. She does not know if he could not find the right words or if he was simply slurring his speech too the point where she couldn't understand him. He did follow commands throughout. Stroke called on arrival to ED. Sx had improved to only a mild R facial droop and moderate dysarthria. No aphasia, focal weakness or numbness, ataxia, or difficulty ambulating. NIHSS = 2. TNK not administered to rapidly improving mild sx as well as contraindication of warfarin. INR was found to be subtherapeutic at 1.5. Head CT showed normal parenchyma and also showed possible hyperdense R ICA terminus / M1 MCA but this was favored to be artifactual given streak throughout this region and the fact that his sx localize to L MCA. SBP 160s on arrival, 130s during stroke code. In a fib rate controlled in 80s throughout our interaction.   STAT MRI brain wo was performed in ED which showed small faint area of restricted diffusion in the white matter of the posterior left frontal lobe which localized to his sx and was felt to represent small hyperacute infarct. 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 There were no hemodynamically significant stenoses seen elsewhere in the head or neck.   TTE, LDL, A1c pending.   ROS   Per HPI; all other systems reviewed and are negative  Past History   Past Medical History:  Diagnosis Date    Atrial fibrillation, chronic (HCC)    BPH (benign prostatic hyperplasia)    Dysrhythmia    Afib   Gilbert syndrome    Renal insufficiency    Past Surgical History:  Procedure Laterality Date   COLONOSCOPY N/A 01/11/2020   Procedure: COLONOSCOPY;  Surgeon: Toledo, Benay Pike, MD;  Location: [facility] ENDOSCOPY;  Service: Gastroenterology;  Laterality: N/A;   TONSILLECTOMY     No family history on file. Social History   Socioeconomic History   Marital status: Unknown    Spouse name: Not on file   Number of children: Not on file   Years of education: Not on file   Highest education level: Not on file  Occupational History   Not on file  Tobacco Use   Smoking status: Never   Smokeless tobacco: Never  Vaping Use   Vaping Use: Never used  Substance and Sexual Activity   Alcohol use: Not Currently   Drug use: Never   Sexual activity: Yes    Birth control/protection: None  Other Topics Concern   Not on file  Social History Narrative   Not on file   Social Determinants of Health   Financial Resource Strain: Not on file  Food Insecurity: Not on file  Transportation Needs: Not on file  Physical Activity: Not on file  Stress: Not on file  Social Connections: Not on file   No  Known Allergies  Medications   (Not in a hospital admission)    Vitals   Vitals:   01/21/21 1500  BP: (!) 161/84  Pulse: 73  Resp: 16  Temp: 97.8 F (36.6 C)  TempSrc: Oral  SpO2: 99%     There is no height or weight on file to calculate BMI.  Physical Exam   Physical Exam Gen: A&O x4, NAD Resp: CTAB CV: RRR Extrem: Nml bulk; no cyanosis, clubbing, or edema.  Neuro: *MS: A&O x4. Follows multi-step commands.  *Speech: fluid, moderate dysarthria, able to name and repeat *CN:    I: Deferred   II,III: PERRLA, VFF by confrontation, optic discs not visualized 2/2 pupillary construction   III,IV,VI: EOMI w/o nystagmus, no ptosis   V: Sensation intact from V1 to V3 to LT   VII:  Eyelid closure was full.  Mild R UMN facial droop   VIII: Hearing intact to voice   IX,X: Voice normal, palate elevates symmetrically    XI: SCM/trap 5/5 bilat   XII: Tongue protrudes midline, no atrophy or fasciculations   *Motor:   Normal bulk.  No tremor, rigidity or bradykinesia. No pronator drift.    Strength: Dlt Bic Tri WrE WrF FgS Gr HF KnF KnE PlF DoF    Left 5 5 5 5 5 5 5 5 5 5 5 5     Right 5 5 5 5 5 5 5 5 5 5 5 5     *Sensory: Intact to light touch, pinprick, temperature vibration throughout. Symmetric. Propioception intact bilat.  No double-simultaneous extinction.  *Coordination:  Finger-to-nose, heel-to-shin, rapid alternating motions were intact. *Reflexes:  2+ and symmetric throughout without clonus; toes down-going bilat *Gait: normal base, normal stride, normal turn. Negative Romberg.  NIHSS = 2 for facial droop and dysarthria   Premorbid mRS = 0   Labs    CBC: No results for input(s): WBC, NEUTROABS, HGB, HCT, MCV, PLT in the last 168 hours.  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  Recommendations   - Given the small size of the acute infarct I recommend continuation of warfarin because the risk of further ischemic events off anticoagulation likely outweighs the risk of hemorrhagic conversion of this small acute stroke. - Consult to pharmacy for warfarin dosing - No indication for antiplatelet in the setting of therapeutic anticoagulation - TTE - Permissive HTN x48 hrs from sx onset goal BP <220/110. PRN  labetalol or hydralazine if BP above these parameters. Avoid oral antihypertensives. Hold home metoprolol for now, patient denies hx RVR. If he does require rate control pls approach it gently to reduce risk of acute hypotension. - Given severe intracranial stenosis distal L MCA hypotension should be strictly avoided. If SBP <130, start gentle hydration. - Check A1c and LDL + add statin per guidelines - q4 hr neuro checks - STAT head CT for any change in neuro exam - Tele - PT/OT/SLP - Stroke education - Amb referral to neurology upon discharge   Will continue to follow.  ______________________________________________________________________   Thank you  for the opportunity to take part in the care of this patient. If you have any further questions, please contact the neurology consultation attending.  Signed,  Su Monks, MD [facility] [phone]  If 7pm- 7am, please page neurology on call as listed in [place].

## 2021-01-21 NOTE — ED Provider Notes (Signed)
[facility] Emergency Department Provider Note  Time seen: 3:22 PM  I have reviewed the triage vital signs and the nursing notes.   HISTORY  Chief Complaint Code Stroke   HPI Hunter Klein is a 80 y.o. male with a past medical history of atrial fibrillation on warfarin, CKD, hyperlipidemia, presents to the emergency department for speech difficulty.  According to the wife around 2 PM today the patient developed speech difficulties in which his speech was garbled.  Patient also attempted to take a drink of water around this time and the water ran out of his mouth per wife.  Patient denies any deficits in either arm or leg denies any confusion.  States his speech is still not normal.  No history of CVA.  Patient made a code stroke upon arrival, neurology currently seeing the patient in conjunction with [place].   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   History of colon polyps 07/18/2019   Renal insufficiency 07/18/2019   Class 1 obesity due to excess calories with serious comorbidity and body mass index (BMI) of 32.0 to 32.9 in adult 01/14/2018   Encounter for general adult medical examination without abnormal findings 06/17/2016   Vaccine counseling 06/17/2016   Benign prostatic hyperplasia with weak urinary stream 12/19/2015   Chronic atrial fibrillation ([place]) 12/19/2015   Pure hypercholesterolemia 12/19/2015    Past Surgical History:  Procedure Laterality Date   COLONOSCOPY N/A 01/11/2020   Procedure: COLONOSCOPY;  Surgeon: Toledo, Benay Pike, MD;  Location: [facility] ENDOSCOPY;  Service: Gastroenterology;  Laterality: N/A;   TONSILLECTOMY      Prior to Admission medications   Medication Sig Start Date End Date Taking? Authorizing  Provider  acetaminophen (TYLENOL) 500 MG tablet Take by mouth.    [provider]  latanoprost (XALATAN) 0.005 % ophthalmic solution Place 1 drop into the right eye at bedtime. 07/23/19   [provider]  lisinopril (ZESTRIL) 2.5 MG tablet Take 2.5 mg by mouth at bedtime. 07/03/19   [provider]  loratadine (CLARITIN) 10 MG tablet Take by mouth.    [provider]  metoprolol succinate (TOPROL-XL) 50 MG 24 hr tablet Take 50 mg by mouth daily. 07/25/19   [provider]  simvastatin (ZOCOR) 40 MG tablet Take 40 mg by mouth at bedtime. 07/05/19   [provider]  warfarin (COUMADIN) 2 MG tablet Take 3 mg by mouth daily. 07/20/19   [provider]    No Known Allergies  No family history on file.  Social History Social History   Tobacco Use   Smoking status: Never   Smokeless tobacco: Never  Vaping Use   Vaping Use: Never used  Substance Use Topics   Alcohol use: Not Currently   Drug use: Never    Review of Systems Constitutional: Negative for fever. Cardiovascular: Negative for chest pain. Respiratory: Negative for shortness of breath. Gastrointestinal: Negative for abdominal pain, vomiting Musculoskeletal: Negative for musculoskeletal complaints Neurological: Negative for headache.  Continues to have speech difficulties. All other ROS negative  ____________________________________________   PHYSICAL EXAM:  VITAL SIGNS: ED Triage Vitals [01/21/21 1500]  Enc Vitals Group     BP (!) 161/84     Pulse Rate  73     Resp 16     Temp 97.8 F (36.6 C)     Temp Source Oral     SpO2 99 %     Weight      Height      Head Circumference      Peak Flow      Pain Score      Pain Loc      Pain Edu?      Excl. in [place]?    Constitutional: Alert and oriented. Well appearing and in no distress. Eyes: Normal exam ENT      Head: Normocephalic and atraumatic.      Mouth/Throat: Mucous membranes are moist. Cardiovascular:  Normal rate, regular rhythm.  Respiratory: Normal respiratory effort without tachypnea nor retractions. Breath sounds are clear  Gastrointestinal: Soft and nontender. No distention. Musculoskeletal: Nontender with normal range of motion in all extremities.  Neurologic: Patient continues to have some speech difficulties.  Equal grip strength bilaterally.  No pronator drift.  5/5 motor in all extremities.  No obvious cranial nerve deficit. Skin:  Skin is warm, dry and intact.  Psychiatric: Mood and affect are normal.   ____________________________________________    EKG  EKG viewed and interpreted by myself shows a sinus rhythm at 71 bpm with a narrow QRS, normal axis, normal intervals, no concerning ST changes.  ____________________________________________    RADIOLOGY  IMPRESSION:  1. Potential hyperdense right ICA terminus/M1 MCA, although this  could be artifactual given streak artifact through this region. CTA  could further evaluate for large vessel occlusion eft clinically  indicated.  2. No evidence of acute large vascular territory infarct or acute  hemorrhage. ASPECTS is 10.   ____________________________________________   INITIAL IMPRESSION / ASSESSMENT AND PLAN / ED COURSE  Pertinent labs & imaging results that were available during my care of the patient were reviewed by me and considered in my medical decision making (see chart for details).   Patient presents emergency department as a code stroke for speech difficulty that began acutely at 2 PM.  No history of CVA.  Patient continues to have some difficulty with speech which is slightly garbled and somewhat slower than typical.  Neurology is currently seeing the patient simultaneously.  Patient has had a CT scan showing potential hypodensity at the right ICA/M1 MCA, neurology does not believe clinically patient presents as LVO.  Otherwise negative CT. we will follow-up with an MRI.  Patient will require admission to  the hospital service for further work-up and treatment.  NIH stroke scale of 2 currently.    Hunter Klein was evaluated in Emergency Department on 01/21/2021 for the symptoms described in the history of present illness. He was evaluated in the context of the global COVID-19 pandemic, which necessitated consideration that the patient might be at risk for infection with the SARS-CoV-2 virus that causes COVID-19. Institutional protocols and algorithms that pertain to the evaluation of patients at risk for COVID-19 are in a state of rapid change based on information released by regulatory bodies including the CDC and federal and state organizations. These policies and algorithms were followed during the patient's care in the ED.  ____________________________________________   FINAL CLINICAL IMPRESSION(S) / ED DIAGNOSES  CVA   Harvest Dark, MD 01/21/21 [phone]

## 2021-01-22 ENCOUNTER — Observation Stay (HOSPITAL_BASED_OUTPATIENT_CLINIC_OR_DEPARTMENT_OTHER)
Admit: 2021-01-22 | Discharge: 2021-01-22 | Disposition: A | Discharge status: Home / Self Care | Payer: Medicare Other | Attending: Neurology | Admitting: Neurology

## 2021-01-22 ENCOUNTER — Other Ambulatory Visit: Payer: Self-pay

## 2021-01-22 ENCOUNTER — Encounter: Payer: Self-pay | Admitting: Internal Medicine

## 2021-01-22 DIAGNOSIS — I6389 Other cerebral infarction: Secondary | ICD-10-CM

## 2021-01-22 DIAGNOSIS — G459 Transient cerebral ischemic attack, unspecified: Secondary | ICD-10-CM

## 2021-01-22 DIAGNOSIS — R2990 Unspecified symptoms and signs involving the nervous system: Secondary | ICD-10-CM | POA: Diagnosis not present

## 2021-01-22 DIAGNOSIS — I639 Cerebral infarction, unspecified: Secondary | ICD-10-CM | POA: Diagnosis not present

## 2021-01-22 DIAGNOSIS — I482 Chronic atrial fibrillation, unspecified: Secondary | ICD-10-CM | POA: Diagnosis not present

## 2021-01-22 LAB — CBC WITH DIFFERENTIAL/PLATELET
Abs Immature Granulocytes: 0.01 10*3/uL (ref 0.00–0.07)
Basophils Absolute: 0 10*3/uL (ref 0.0–0.1)
Basophils Relative: 0 %
Eosinophils Absolute: 0 10*3/uL (ref 0.0–0.5)
Eosinophils Relative: 1 %
HCT: 47.6 % (ref 39.0–52.0)
Hemoglobin: 16.3 g/dL (ref 13.0–17.0)
Immature Granulocytes: 0 %
Lymphocytes Relative: 23 %
Lymphs Abs: 1.4 10*3/uL (ref 0.7–4.0)
MCH: 32.9 pg (ref 26.0–34.0)
MCHC: 34.2 g/dL (ref 30.0–36.0)
MCV: 96 fL (ref 80.0–100.0)
Monocytes Absolute: 0.5 10*3/uL (ref 0.1–1.0)
Monocytes Relative: 7 %
Neutro Abs: 4.2 10*3/uL (ref 1.7–7.7)
Neutrophils Relative %: 69 %
Platelets: 167 10*3/uL (ref 150–400)
RBC: 4.96 MIL/uL (ref 4.22–5.81)
RDW: 13.1 % (ref 11.5–15.5)
WBC: 6.2 10*3/uL (ref 4.0–10.5)
nRBC: 0 % (ref 0.0–0.2)

## 2021-01-22 LAB — ECHOCARDIOGRAM COMPLETE BUBBLE STUDY
AR max vel: 3.05 cm2
AV Area VTI: 2.7 cm2
AV Area mean vel: 2.88 cm2
AV Mean grad: 2 mmHg
AV Peak grad: 3.5 mmHg
Ao pk vel: 0.93 m/s
Area-P 1/2: 9.98 cm2
MV VTI: 2.55 cm2
S' Lateral: 3.05 cm

## 2021-01-22 LAB — COMPREHENSIVE METABOLIC PANEL
ALT: 16 U/L (ref 0–44)
AST: 19 U/L (ref 15–41)
Albumin: 3.8 g/dL (ref 3.5–5.0)
Alkaline Phosphatase: 64 U/L (ref 38–126)
Anion gap: 6 (ref 5–15)
BUN: 21 mg/dL (ref 8–23)
CO2: 27 mmol/L (ref 22–32)
Calcium: 8.7 mg/dL — ABNORMAL LOW (ref 8.9–10.3)
Chloride: 105 mmol/L (ref 98–111)
Creatinine, Ser: 0.92 mg/dL (ref 0.61–1.24)
GFR, Estimated: >60 mL/min (ref >60)
Glucose, Bld: 101 mg/dL — ABNORMAL HIGH (ref 70–99)
Potassium: 4.5 mmol/L (ref 3.5–5.1)
Sodium: 138 mmol/L (ref 135–145)
Total Bilirubin: 3.2 mg/dL — ABNORMAL HIGH (ref 0.3–1.2)
Total Protein: 6.7 g/dL (ref 6.5–8.1)

## 2021-01-22 LAB — LIPID PANEL
Cholesterol: 112 mg/dL (ref 0–200)
HDL: 41 mg/dL (ref >40)
LDL Cholesterol: 58 mg/dL (ref 0–99)
Total CHOL/HDL Ratio: 2.7 RATIO
Triglycerides: 65 mg/dL (ref <150)
VLDL: 13 mg/dL (ref 0–40)

## 2021-01-22 LAB — HEMOGLOBIN A1C
Hgb A1c MFr Bld: 5.1 % (ref 4.8–5.6)
Hgb A1c MFr Bld: 4.8 % (ref 4.8–5.6)
Mean Plasma Glucose: 99.67 mg/dL
Mean Plasma Glucose: 91.06 mg/dL

## 2021-01-22 LAB — RESP PANEL BY RT-PCR (FLU A&B, COVID) ARPGX2
Influenza A by PCR: NEGATIVE
Influenza B by PCR: NEGATIVE
SARS Coronavirus 2 by RT PCR: NEGATIVE

## 2021-01-22 LAB — MAGNESIUM: Magnesium: 2 mg/dL (ref 1.7–2.4)

## 2021-01-22 LAB — PROTIME-INR
INR: 1.6 — ABNORMAL HIGH (ref 0.8–1.2)
Prothrombin Time: 19.3 seconds — ABNORMAL HIGH (ref 11.4–15.2)

## 2021-01-22 LAB — LDL CHOLESTEROL, DIRECT: Direct LDL: 62.7 mg/dL (ref 0–99)

## 2021-01-22 LAB — PHOSPHORUS: Phosphorus: 3 mg/dL (ref 2.5–4.6)

## 2021-01-22 MED ORDER — WARFARIN SODIUM 3 MG PO TABS: 3.0000 mg | ORAL_TABLET | Freq: Every day | ORAL | 0 refills | Status: AC

## 2021-01-22 MED ORDER — ENOXAPARIN SODIUM 100 MG/ML IJ SOSY: 100.0000 mg | PREFILLED_SYRINGE | Freq: Two times a day (BID) | INTRAMUSCULAR | 0 refills | Status: DC

## 2021-01-22 MED ORDER — ENOXAPARIN SODIUM 100 MG/ML IJ SOSY
1.0000 mg/kg | PREFILLED_SYRINGE | Freq: Two times a day (BID) | INTRAMUSCULAR | Status: DC
  Administered 2021-01-22: 97.5 mg via SUBCUTANEOUS
  Filled 2021-01-22 (×2): qty 0.97

## 2021-01-22 MED ORDER — INFLUENZA VAC A&B SA ADJ QUAD 0.5 ML IM PRSY: 0.5000 mL | PREFILLED_SYRINGE | INTRAMUSCULAR | Status: DC

## 2021-01-22 MED ORDER — WARFARIN SODIUM 4 MG PO TABS
4.0000 mg | ORAL_TABLET | Freq: Once | ORAL | Status: AC
  Administered 2021-01-22: 4 mg via ORAL
  Filled 2021-01-22: qty 1

## 2021-01-22 MED ORDER — WARFARIN SODIUM 5 MG PO TABS
5.0000 mg | ORAL_TABLET | Freq: Once | ORAL | Status: DC
  Filled 2021-01-22: qty 1

## 2021-01-22 NOTE — Discharge Summary (Signed)
Physician Discharge Summary  Hunter Klein DPO:[id number] DOB: [birth year] DOA: 01/21/2021  PCP: Dion Body, MD  Admit date: 01/21/2021 Discharge date: 01/22/2021  Admitted From: Home Disposition: Home with outpatient SLP  Recommendations for Outpatient Follow-up:  Follow up with PCP in 1-2 weeks Follow-up with Neurology within 6 weeks Please obtain CMP/CBC, Mag, Phos in one week Take Lovenox bridge for 5 days and Coumadin 3 mg daily or until INR is therapeutic; needs a PT/INR check in 2 days to evaluate and have further adjustments made at the Coumadin clinic Please follow up on the following pending results:  Home Health: No  Equipment/Devices: None   Discharge Condition: Stable  CODE STATUS: FULL CODE Diet recommendation: Heart Healthy Diet   Brief/Interim Summary: The patient is a 80 year old male with a past medical history significant for but not limited to chronic atrial fibrillation, BPH, does arrhythmia, history of Gilbert syndrome, chronic kidney disease stage II as well as other comorbidities who presented to the hospital complaining of a left facial droop and slurred speech.  The patient states he was in normal state of health until 2 PM yesterday when he woke up from nap and started feeling left-sided facial droop.  Per his wife he also had slurred speech and he stated that his right hand felt funny but did not have any significant weakness.  He was still able to walk and did not have any headache or dizziness.  Because of his symptoms he came to the emergency room and on arrival to the emergency room he still had slurred speech but the facial droop resolved.  He had a head CT done which showed a hyperdensity in the MCA distribution which was not able to rule out infarct.  Neurology was consulted and MRI brain, MRA head and neck as well as echocardiogram ordered for further evaluation and work-up patient also need PT OT to further evaluate and treat and they are  recommending no follow-up.  Echocardiogram was done and read showed a normal EF of 60 to 65% and the bubble study was nondiagnostic due to poor acoustic windows but did not show any evidence of intracardiac thrombus.  Because patient's Coumadin level was low he will be sent out on a Lovenox bridge and change his Coumadin level to 3 mg daily and he will have a follow-up with the Coumadin clinic in 2 days for further warfarin management  Discharge Diagnoses:  Active Problems:   Chronic atrial fibrillation (HCC)   CVA (cerebral vascular accident) ([place])  Strokelike symptoms with acute CVA of the left brain with hyperdensity in the MCA distribution  -Presented with a left facial droop and slurred speech -Head CT done and showed "Potential hyperdense right ICA terminus/M1 MCA, although this could be artifactual given streak artifact through this region. CTA could further evaluate for large vessel occlusion eft clinically indicated. No evidence of acute large vascular territory infarct or acute hemorrhage. ASPECTS is 10." -MRI Brain and MRA Head and Neck showed "Faint area of restricted diffusion in the white matter of the posterior left frontal lobe may represent hyperacute infarct. No intracranial large vessel occlusion. High-grade stenosis at the origin of a left M3/MCA middle division branch. No hemodynamically significant stenosis in the neck. Mild chronic microvascular ischemic changes of the white matter." -Patient is anticoagulated with warfarin and neurology recommending no indication for antiplatelets if warfarin does not need to be held; warfarin was subtherapeutic likely the cause of his CVA -Echocardiogram with Bubble done and showed normal EF -Continue  to monitor on telemetry and neurochecks per protocol -Lipid panel showed a total cholesterol/HDL ratio of 2.7, cholesterol of 112, HDL 41, LDL 58, triglycerides of 65, VLDL of 13 -Hemoglobin A1c was obtained and was 5.1 -We will need PT OT and  SLP to further evaluate and treat -Neurology recommending permissive hypertension from symptom onset until stroke is ruled out by MRI with a goal blood pressure of less than 220/110 -They are recommending as needed labetalol or hydralazine if blood pressure gets above these parameters and recommending avoiding oral antihypertensives and holding home metoprolol for now -Neurology recommending a stat head CT scan for any change in neuro examination -Patient passed bedside [place] swallow screen and so will place on a heart healthy diet -Continue with Simvastatin 40 mg p.o. nightly -Given his Subtherapeutic INR will D/C on Coumadin 3 mg and Lovenox Bridge for 5 Days and have patient repeat INR in 1-2 days  -Patient will need an Ambulatory referral to neurology upon discharge and I made a referral in 6 weeks to [facility]   Chronic atrial fibrillation -Imaging as above -Continue with Coumadin per pharmacy dosing -INR was subtherapeutic at 1.6 and likely the cause of an acute CVA -Patient received warfarin 5 mg once yesterday and 4 mg today and will resume 3 mg daily at discharge -Will place on Lovenox Bridge for 5 days and will need further follow-up in outpatient setting -Continue to Monitor    CKD stage II -Patient's BUNs/creatinine was 23/1.04 -Repeat pending and appears creatinine is at baseline on admission -Avoid further nephrotoxic medications, contrast dyes, hypotension renally dose medications -Repeat CMP in a.m.   Hyperlipidemia -Lipid panel as above -Continue with Simvastatin 40 g p.o. nightly   Hyperbilirubinemia -Patient's T bili is 2.4 and worsened to 3.2 -Likely reactive  -Continue monitor and trend and repeat CMP within 1 week and if continues to worsen will obtain a RUQ U/S as an outpatient  Hyperglycemia -Likely reactive as patient is not diabetic with a hemoglobin A1c of 5.1 -Continue to monitor blood sugars per protocol and if necessary will place on sensitive duloxetine,  insulin   Obesity -Complicates overall prognosis and care -Estimated body mass index is 31.09 kg/m as calculated from the following:   Height as of this encounter: 5\' 10"  (1.778 m).   Weight as of this encounter: 98.3 kg.. -Weight Loss and Dietary Counseling given    Discharge Instructions  Discharge Instructions     Ambulatory referral to Neurology   Complete by: As directed    An appointment is requested in approximately: 6 weeks   Call MD for:  difficulty breathing, headache or visual disturbances   Complete by: As directed    Call MD for:  extreme fatigue   Complete by: As directed    Call MD for:  hives   Complete by: As directed    Call MD for:  persistant dizziness or light-headedness   Complete by: As directed    Call MD for:  persistant nausea and vomiting   Complete by: As directed    Call MD for:  redness, tenderness, or signs of infection (pain, swelling, redness, odor or green/yellow discharge around incision site)   Complete by: As directed    Call MD for:  severe uncontrolled pain   Complete by: As directed    Call MD for:  temperature >100.4   Complete by: As directed    Diet - low sodium heart healthy   Complete by: As directed  Discharge instructions   Complete by: As directed    You were cared for by a hospitalist during your hospital stay. If you have any questions about your discharge medications or the care you received while you were in the hospital after you are discharged, you can call the unit and ask to speak with the hospitalist on call if the hospitalist that took care of you is not available. Once you are discharged, your primary care physician will handle any further medical issues. Please note that NO REFILLS for any discharge medications will be authorized once you are discharged, as it is imperative that you return to your primary care physician (or establish a relationship with a primary care physician if you do not have one) for your  aftercare needs so that they can reassess your need for medications and monitor your lab values.  Follow up with PCP and Neurology within 1-2 weeks. Take all medications as prescribed. If symptoms change or worsen please return to the ED for evaluation   Increase activity slowly   Complete by: As directed       Allergies as of 01/22/2021   No Known Allergies      Medication List     TAKE these medications    acetaminophen 500 MG tablet Commonly known as: TYLENOL Take by mouth.   enoxaparin 100 MG/ML injection Commonly known as: LOVENOX Inject 1 mL (100 mg total) into the skin every 12 (twelve) hours for 5 days.   latanoprost 0.005 % ophthalmic solution Commonly known as: XALATAN Place 1 drop into the right eye at bedtime.   lisinopril 2.5 MG tablet Commonly known as: ZESTRIL Take 2.5 mg by mouth at bedtime.   loratadine 10 MG tablet Commonly known as: CLARITIN Take by mouth.   metoprolol succinate 50 MG 24 hr tablet Commonly known as: TOPROL-XL Take 50 mg by mouth daily.   simvastatin 40 MG tablet Commonly known as: ZOCOR Take 40 mg by mouth at bedtime.   warfarin 2 MG tablet Commonly known as: COUMADIN Take 2 mg by mouth daily. Pt taking 2 mg tuesdays and thursdays. Taking 3 mg mondays, wednesdays, fridays, saturdays, and sundays       No Known Allergies  Consultations: Neurology  Procedures/Studies: MR ANGIO HEAD WO CONTRAST  Result Date: 01/21/2021 CLINICAL DATA:  Neuro deficit, acute, stroke suspected. EXAM: MRI HEAD WITHOUT CONTRAST MRA HEAD WITHOUT CONTRAST MRA NECK WITHOUT CONTRAST TECHNIQUE: Multiplanar, multi-echo pulse sequences of the brain and surrounding structures were acquired without intravenous contrast. Angiographic images of the Circle of Willis were acquired using MRA technique without intravenous contrast. Angiographic images of the neck were acquired using MRA technique without intravenous contrast. Carotid stenosis measurements  (when applicable) are obtained utilizing NASCET criteria, using the distal internal carotid diameter as the denominator. COMPARISON:  Head CT January 21, 2021. FINDINGS: MRI HEAD FINDINGS Brain: Focal area of restricted diffusion within the white matter of the posterior left frontal lobe without corresponding T2 hyperintensity. No hemorrhage, hydrocephalus, extra-axial collection or mass lesion. Small amount of scattered foci of T2 hyperintensity are seen within the white matter of the cerebral hemispheres and within the pons, nonspecific, most likely related to chronic small vessel ischemia. Vascular: Normal flow voids. Skull and upper cervical spine: Normal marrow signal. Sinuses/Orbits: Bilateral lens surgery. Paranasal sinuses are clear. Other: None. MRA HEAD FINDINGS The study is partially degraded by motion. Anterior circulation: Normal flow related enhancement of the intracranial carotid arteries. A 3 mm medially projecting outpouching from  the cavernous segment of the right ICA, consistent with small aneurysm. High-grade stenosis is seen at the origin of a left M3/MCA middle division branch, suggestive of intracranial atherosclerotic disease. No large vessel occlusion identified in the anterior circulation. Normal flow related enhancement in the right MCA and bilateral ACA vascular tree. Posterior circulation: Normal flow related enhancement of the intracranial vertebral arteries, basilar artery and bilateral posterior cerebral arteries in, noting origin of the right posterior cerebral artery directly from the right ICA (fetal PCA). Anatomic variants: Right fetal PCA. MRA NECK FINDINGS Normal course and caliber of the visualized portions of the bilateral common carotid arteries, carotid bifurcation and cervical internal carotid arteries. No hemodynamically significant stenosis seen. Normal caliber and flow related enhancement of the visualized V2 segment of the bilateral vertebral arteries. IMPRESSION: 1.  Faint area of restricted diffusion in the white matter of the posterior left frontal lobe may represent hyperacute infarct. 2. No intracranial large vessel occlusion. 3. High-grade stenosis at the origin of a left M3/MCA middle division branch. 4. No hemodynamically significant stenosis in the neck. 5. 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 These results were called by telephone at the time of interpretation on 01/21/2021 at 4:45 pm to provider Su Monks, MD , who verbally acknowledged these results. Electronically Signed   By: Pedro Earls M.D.   On: 01/21/2021 16:45   MR ANGIO NECK WO CONTRAST  Result Date: 01/21/2021 CLINICAL DATA:  Neuro deficit, acute, stroke suspected. EXAM: MRI HEAD WITHOUT CONTRAST MRA HEAD WITHOUT CONTRAST MRA NECK WITHOUT CONTRAST TECHNIQUE: Multiplanar, multi-echo pulse sequences of the brain and surrounding structures were acquired without intravenous contrast. Angiographic images of the Circle of Willis were acquired using MRA technique without intravenous contrast. Angiographic images of the neck were acquired using MRA technique without intravenous contrast. Carotid stenosis measurements (when applicable) are obtained utilizing NASCET criteria, using the distal internal carotid diameter as the denominator. COMPARISON:  Head CT January 21, 2021. FINDINGS: MRI HEAD FINDINGS Brain: Focal area of restricted diffusion within the white matter of the posterior left frontal lobe without corresponding T2 hyperintensity. No hemorrhage, hydrocephalus, extra-axial collection or mass lesion. Small amount of scattered foci of T2 hyperintensity are seen within the white matter of the cerebral hemispheres and within the pons, nonspecific, most likely related to chronic small vessel ischemia. Vascular: Normal flow voids. Skull and upper cervical spine: Normal marrow signal. Sinuses/Orbits: Bilateral lens surgery. Paranasal sinuses are clear. Other:  None. MRA HEAD FINDINGS The study is partially degraded by motion. Anterior circulation: Normal flow related enhancement of the intracranial carotid arteries. A 3 mm medially projecting outpouching from the cavernous segment of the right ICA, consistent with small aneurysm. High-grade stenosis is seen at the origin of a left M3/MCA middle division branch, suggestive of intracranial atherosclerotic disease. No large vessel occlusion identified in the anterior circulation. Normal flow related enhancement in the right MCA and bilateral ACA vascular tree. Posterior circulation: Normal flow related enhancement of the intracranial vertebral arteries, basilar artery and bilateral posterior cerebral arteries in, noting origin of the right posterior cerebral artery directly from the right ICA (fetal PCA). Anatomic variants: Right fetal PCA. MRA NECK FINDINGS Normal course and caliber of the visualized portions of the bilateral common carotid arteries, carotid bifurcation and cervical internal carotid arteries. No hemodynamically significant stenosis seen. Normal caliber and flow related enhancement of the visualized V2 segment of the bilateral vertebral arteries. IMPRESSION: 1. Faint area of restricted diffusion in the white  matter of the posterior left frontal lobe may represent hyperacute infarct. 2. No intracranial large vessel occlusion. 3. High-grade stenosis at the origin of a left M3/MCA middle division branch. 4. No hemodynamically significant stenosis in the neck. 5. 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 These results were called by telephone at the time of interpretation on 01/21/2021 at 4:45 pm to provider Su Monks, MD , who verbally acknowledged these results. Electronically Signed   By: Pedro Earls M.D.   On: 01/21/2021 16:45   MR BRAIN WO CONTRAST  Result Date: 01/21/2021 CLINICAL DATA:  Neuro deficit, acute, stroke suspected. EXAM: MRI HEAD WITHOUT  CONTRAST MRA HEAD WITHOUT CONTRAST MRA NECK WITHOUT CONTRAST TECHNIQUE: Multiplanar, multi-echo pulse sequences of the brain and surrounding structures were acquired without intravenous contrast. Angiographic images of the Circle of Willis were acquired using MRA technique without intravenous contrast. Angiographic images of the neck were acquired using MRA technique without intravenous contrast. Carotid stenosis measurements (when applicable) are obtained utilizing NASCET criteria, using the distal internal carotid diameter as the denominator. COMPARISON:  Head CT January 21, 2021. FINDINGS: MRI HEAD FINDINGS Brain: Focal area of restricted diffusion within the white matter of the posterior left frontal lobe without corresponding T2 hyperintensity. No hemorrhage, hydrocephalus, extra-axial collection or mass lesion. Small amount of scattered foci of T2 hyperintensity are seen within the white matter of the cerebral hemispheres and within the pons, nonspecific, most likely related to chronic small vessel ischemia. Vascular: Normal flow voids. Skull and upper cervical spine: Normal marrow signal. Sinuses/Orbits: Bilateral lens surgery. Paranasal sinuses are clear. Other: None. MRA HEAD FINDINGS The study is partially degraded by motion. Anterior circulation: Normal flow related enhancement of the intracranial carotid arteries. A 3 mm medially projecting outpouching from the cavernous segment of the right ICA, consistent with small aneurysm. High-grade stenosis is seen at the origin of a left M3/MCA middle division branch, suggestive of intracranial atherosclerotic disease. No large vessel occlusion identified in the anterior circulation. Normal flow related enhancement in the right MCA and bilateral ACA vascular tree. Posterior circulation: Normal flow related enhancement of the intracranial vertebral arteries, basilar artery and bilateral posterior cerebral arteries in, noting origin of the right posterior cerebral  artery directly from the right ICA (fetal PCA). Anatomic variants: Right fetal PCA. MRA NECK FINDINGS Normal course and caliber of the visualized portions of the bilateral common carotid arteries, carotid bifurcation and cervical internal carotid arteries. No hemodynamically significant stenosis seen. Normal caliber and flow related enhancement of the visualized V2 segment of the bilateral vertebral arteries. IMPRESSION: 1. Faint area of restricted diffusion in the white matter of the posterior left frontal lobe may represent hyperacute infarct. 2. No intracranial large vessel occlusion. 3. High-grade stenosis at the origin of a left M3/MCA middle division branch. 4. No hemodynamically significant stenosis in the neck. 5. 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 These results were called by telephone at the time of interpretation on 01/21/2021 at 4:45 pm to provider Su Monks, MD , who verbally acknowledged these results. Electronically Signed   By: Pedro Earls M.D.   On: 01/21/2021 16:45   ECHOCARDIOGRAM COMPLETE BUBBLE STUDY  Result Date: 01/22/2021    ECHOCARDIOGRAM REPORT   Patient Name:   CON ARGANBRIGHT Date of Exam: 01/22/2021 Medical Rec #:  [id number]      Height:       69.5 in Accession #:    [id number]  Weight:       224.9 lb Date of Birth:  [birth year]       BSA:          2.182 m Patient Age:    11 years       BP:           142/98 mmHg Patient Gender: M              HR:           66 bpm. Exam Location:  [facility] Procedure: 2D Echo, Cardiac Doppler, Color Doppler and Saline Contrast Bubble            Study Indications:     Stroke 434.91 / I63.9  History:         Patient has no prior history of Echocardiogram examinations.                  Arrythmias:Atrial Fibrillation.  Sonographer:     Sherrie Sport Referring Phys:  Assumption Diagnosing Phys: Nelva Bush MD  Sonographer Comments: Suboptimal apical window. IMPRESSIONS  1. Left ventricular  ejection fraction, by estimation, is 60 to 65%. The left ventricle has normal function. Left ventricular endocardial border not optimally defined to evaluate regional wall motion. There is mild left ventricular hypertrophy. Left ventricular diastolic function could not be evaluated.  2. Right ventricular systolic function is normal. The right ventricular size is normal.  3. Left atrial size was mildly dilated.  4. Right atrial size was moderately dilated.  5. The mitral valve is grossly normal. Trivial mitral valve regurgitation. No evidence of mitral stenosis.  6. The aortic valve is tricuspid. There is mild thickening of the aortic valve. Aortic valve regurgitation is not visualized. No aortic stenosis is present.  7. Bubble study is non-diagnostic due to poor acoustic windows. FINDINGS  Left Ventricle: Left ventricular ejection fraction, by estimation, is 60 to 65%. The left ventricle has normal function. Left ventricular endocardial border not optimally defined to evaluate regional wall motion. The left ventricular internal cavity size was normal in size. There is mild left ventricular hypertrophy. Left ventricular diastolic function could not be evaluated due to atrial fibrillation. Left ventricular diastolic function could not be evaluated. Right Ventricle: The right ventricular size is normal. No increase in right ventricular wall thickness. Right ventricular systolic function is normal. Left Atrium: Left atrial size was mildly dilated. Right Atrium: Right atrial size was moderately dilated. Pericardium: The pericardium was not well visualized. Mitral Valve: The mitral valve is grossly normal. Trivial mitral valve regurgitation. No evidence of mitral valve stenosis. MV peak gradient, 4.4 mmHg. The mean mitral valve gradient is 2.0 mmHg. Tricuspid Valve: The tricuspid valve is grossly normal. Tricuspid valve regurgitation is trivial. Aortic Valve: The aortic valve is tricuspid. There is mild thickening of the  aortic valve. Aortic valve regurgitation is not visualized. No aortic stenosis is present. Aortic valve mean gradient measures 2.0 mmHg. Aortic valve peak gradient measures 3.5 mmHg. Aortic valve area, by VTI measures 2.70 cm. Pulmonic Valve: The pulmonic valve was not well visualized. Pulmonic valve regurgitation is not visualized. No evidence of pulmonic stenosis. Aorta: The aortic root is normal in size and structure. Pulmonary Artery: The pulmonary artery is not well seen. Venous: The inferior vena cava was not well visualized. IAS/Shunts: The interatrial septum was not well visualized. Agitated saline contrast was given intravenously to evaluate for intracardiac shunting. Bubble study is non-diagnostic due to poor acoustic windows.  LEFT VENTRICLE  PLAX 2D LVIDd:         4.27 cm LVIDs:         3.05 cm LV PW:         1.41 cm LV IVS:        0.98 cm LVOT diam:     2.00 cm LV SV:         53 LV SV Index:   24 LVOT Area:     3.14 cm  RIGHT VENTRICLE RV Basal diam:  3.96 cm RV S prime:     12.60 cm/s TAPSE (M-mode): 4.9 cm LEFT ATRIUM           Index        RIGHT ATRIUM           Index LA diam:      6.70 cm 3.07 cm/m   RA Area:     29.60 cm LA Vol (A2C): 50.9 ml 23.32 ml/m  RA Volume:   98.70 ml  45.23 ml/m LA Vol (A4C): 70.4 ml 32.26 ml/m  AORTIC VALVE                    PULMONIC VALVE AV Area (Vmax):    3.05 cm     PV Vmax:        0.86 m/s AV Area (Vmean):   2.88 cm     PV Vmean:       64.800 cm/s AV Area (VTI):     2.70 cm     PV VTI:         0.167 m AV Vmax:           93.30 cm/s   PV Peak grad:   3.0 mmHg AV Vmean:          66.800 cm/s  PV Mean grad:   2.0 mmHg AV VTI:            0.197 m      RVOT Peak grad: 3 mmHg AV Peak Grad:      3.5 mmHg AV Mean Grad:      2.0 mmHg LVOT Vmax:         90.70 cm/s LVOT Vmean:        61.200 cm/s LVOT VTI:          0.169 m LVOT/AV VTI ratio: 0.86  AORTA Ao Root diam: 2.90 cm MITRAL VALVE               TRICUSPID VALVE MV Area (PHT): 9.98 cm    TR Peak grad:   10.1 mmHg MV  Area VTI:   2.55 cm    TR Vmax:        159.00 cm/s MV Peak grad:  4.4 mmHg MV Mean grad:  2.0 mmHg    SHUNTS MV Vmax:       1.05 m/s    Systemic VTI:  0.17 m MV Vmean:      60.3 cm/s   Systemic Diam: 2.00 cm MV Decel Time: 76 msec     Pulmonic VTI:  0.193 m MV E velocity: 96.00 cm/s Nelva Bush MD Electronically signed by Nelva Bush MD Signature Date/Time: 01/22/2021/4:23:33 PM    Final    CT HEAD CODE STROKE WO CONTRAST  Result Date: 01/21/2021 CLINICAL DATA:  Code stroke.  Neuro deficit, acute, stroke suspected EXAM: CT HEAD WITHOUT CONTRAST TECHNIQUE: Contiguous axial images were obtained from the base of the skull through the vertex without intravenous contrast. COMPARISON:  None. FINDINGS:  Brain: No evidence of acute large vascular territory infarction, hemorrhage, hydrocephalus, extra-axial collection or mass lesion/mass effect. Patchy white matter hypoattenuation, nonspecific but compatible with chronic microvascular ischemic disease. Atrophy with ex vacuo ventricular dilation. Vascular: Potential hyperdense right ICA terminus/M1 MCA, although this could be artifatual given streak artifact through this region. Calcific intracranial atherosclerosis. Skull: No acute fracture. Sinuses/Orbits: Visualized sinuses are clear. No acute orbital findings. Other: No mastoid effusions. ASPECTS [facility] Stroke Program Early CT Score) total score (0-10 with 10 being normal): 10 IMPRESSION: 1. Potential hyperdense right ICA terminus/M1 MCA, although this could be artifactual given streak artifact through this region. CTA could further evaluate for large vessel occlusion eft clinically indicated. 2. No evidence of acute large vascular territory infarct or acute hemorrhage. ASPECTS is 10. Electronically Signed   By: Margaretha Sheffield M.D.   On: 01/21/2021 15:21     Subjective: Seen and examined at bedside and patient thinks he is doing much better and his slurred speech is improved and resolved.  States that  when he tried to drink a soda yesterday it ran down the side of his face.  No nausea or vomiting.  Getting his echocardiogram and showed a normal EF of 60 to 65%.  Feels relatively well and PT OT recommended no follow-up.  Discharge Exam: Vitals:   01/22/21 1231 01/22/21 1544  BP: 126/74 111/71  Pulse: 70 80  Resp: 18 20  Temp: 97.8 F (36.6 C) 97.6 F (36.4 C)  SpO2: 98% 97%   Vitals:   01/22/21 0848 01/22/21 0902 01/22/21 1231 01/22/21 1544  BP: (!) 142/98  126/74 111/71  Pulse: 66  70 80  Resp: 16  18 20   Temp: 97.8 F (36.6 C)  97.8 F (36.6 C) 97.6 F (36.4 C)  TempSrc: Oral  Oral Oral  SpO2: 100%  98% 97%  Weight:  98.3 kg    Height:  5\' 10"  (1.778 m)     Examination: Physical Exam:   Constitutional: WN/WD obese Caucasian male in no acute distress appears calm  Eyes: Lids and conjunctivae normal, sclerae anicteric  ENMT: External Ears, Nose appear normal. Grossly normal hearing.  Neck: Appears normal, supple, no cervical masses, normal ROM, no appreciable thyromegaly; no appreciable JVD Respiratory: Mildly diminished to auscultation bilaterally, no wheezing, rales, rhonchi or crackles. Normal respiratory effort and patient is not tachypenic. No accessory muscle use.  Unlabored breathing Cardiovascular: RRR, no murmurs / rubs / gallops. S1 and S2 auscultated.  No appreciable extremity edema Abdomen: Soft, non-tender, distended secondary to body habitus. Bowel sounds positive.  GU: Deferred. Musculoskeletal: No clubbing / cyanosis of digits/nails. No joint deformity upper and lower extremities.  Skin: No rashes, lesions, ulcers on limited skin evaluation. No induration; Warm and dry.  Neurologic: CN 2-12 grossly intact with no focal deficits. Romberg sign and cerebellar reflexes not assessed.  Psychiatric: Normal judgment and insight. Alert and oriented x 3. Normal mood and appropriate affect.   The results of significant diagnostics from this hospitalization (including  imaging, microbiology, ancillary and laboratory) are listed below for reference.    Microbiology: Recent Results (from the past 240 hour(s))  Resp Panel by RT-PCR (Flu A&B, Covid) Nasopharyngeal Swab     Status: None   Collection Time: 01/22/21  6:50 AM   Specimen: Nasopharyngeal Swab; Nasopharyngeal(NP) swabs in vial transport medium  Result Value Ref Range Status   SARS Coronavirus 2 by RT PCR NEGATIVE NEGATIVE Final    Comment: (NOTE) SARS-CoV-2 target nucleic acids are NOT  DETECTED.  The SARS-CoV-2 RNA is generally detectable in upper respiratory specimens during the acute phase of infection. The lowest concentration of SARS-CoV-2 viral copies this assay can detect is 138 copies/mL. A negative result does not preclude SARS-Cov-2 infection and should not be used as the sole basis for treatment or other patient management decisions. A negative result may occur with  improper specimen collection/handling, submission of specimen other than nasopharyngeal swab, presence of viral mutation(s) within the areas targeted by this assay, and inadequate number of viral copies(<138 copies/mL). A negative result must be combined with clinical observations, patient history, and epidemiological information. The expected result is Negative.  Fact Sheet for Patients:  EntrepreneurPulse.com.au  Fact Sheet for Healthcare Providers:  IncredibleEmployment.be  This test is no t yet approved or cleared by the [place] FDA and  has been authorized for detection and/or diagnosis of SARS-CoV-2 by FDA under an Emergency Use Authorization (EUA). This EUA will remain  in effect (meaning this test can be used) for the duration of the COVID-19 declaration under Section 564(b)(1) of the Act, 21 U.S.C.section 360bbb-3(b)(1), unless the authorization is terminated  or revoked sooner.       Influenza A by PCR NEGATIVE NEGATIVE Final   Influenza B by PCR NEGATIVE  NEGATIVE Final    Comment: (NOTE) The Xpert Xpress SARS-CoV-2/FLU/RSV plus assay is intended as an aid in the diagnosis of influenza from Nasopharyngeal swab specimens and should not be used as a sole basis for treatment. Nasal washings and aspirates are unacceptable for Xpert Xpress SARS-CoV-2/FLU/RSV testing.  Fact Sheet for Patients: EntrepreneurPulse.com.au  Fact Sheet for Healthcare Providers: IncredibleEmployment.be  This test is not yet approved or cleared by the [place] FDA and has been authorized for detection and/or diagnosis of SARS-CoV-2 by FDA under an Emergency Use Authorization (EUA). This EUA will remain in effect (meaning this test can be used) for the duration of the COVID-19 declaration under Section 564(b)(1) of the Act, 21 U.S.C. section 360bbb-3(b)(1), unless the authorization is terminated or revoked.  Performed at [facility], [address] [place], [place] [address]     Labs: BNP (last 3 results) No results for input(s): BNP in the last 8760 hours. Basic Metabolic Panel: Recent Labs  Lab 01/21/21 1515 01/22/21 0912  NA 141 138  K 4.2 4.5  CL 106 105  CO2 29 27  GLUCOSE 109* 101*  BUN 23 21  CREATININE 1.04 0.92  CALCIUM 9.1 8.7*  MG  --  2.0  PHOS  --  3.0   Liver Function Tests: Recent Labs  Lab 01/21/21 1515 01/22/21 0912  AST 22 19  ALT 14 16  ALKPHOS 79 64  BILITOT 2.4* 3.2*  PROT 7.3 6.7  ALBUMIN 4.2 3.8   No results for input(s): LIPASE, AMYLASE in the last 168 hours. No results for input(s): AMMONIA in the last 168 hours. CBC: Recent Labs  Lab 01/21/21 1515 01/22/21 0912  WBC 6.9 6.2  NEUTROABS 4.9 4.2  HGB 16.4 16.3  HCT 48.4 47.6  MCV 97.0 96.0  PLT 182 167   Cardiac Enzymes: No results for input(s): CKTOTAL, CKMB, CKMBINDEX, TROPONINI in the last 168 hours. BNP: Invalid input(s): POCBNP CBG: Recent Labs  Lab 01/21/21 1458  GLUCAP 136*   D-Dimer No  results for input(s): DDIMER in the last 72 hours. Hgb A1c Recent Labs    01/21/21 1515 01/22/21 0650  HGBA1C 5.1 4.8   Lipid Profile Recent Labs    01/21/21 1515 01/22/21  0650  CHOL  --  112  HDL  --  41  LDLCALC  --  58  TRIG  --  65  CHOLHDL  --  2.7  LDLDIRECT 62.7  --    Thyroid function studies No results for input(s): TSH, T4TOTAL, T3FREE, THYROIDAB in the last 72 hours.  Invalid input(s): FREET3 Anemia work up No results for input(s): VITAMINB12, FOLATE, FERRITIN, TIBC, IRON, RETICCTPCT in the last 72 hours. Urinalysis    Component Value Date/Time   APPEARANCEUR Cloudy (A) 09/06/2020 0857   GLUCOSEU Negative 09/06/2020 0857   BILIRUBINUR Negative 09/06/2020 0857   PROTEINUR Negative 09/06/2020 0857   NITRITE Negative 09/06/2020 0857   LEUKOCYTESUR Negative 09/06/2020 0857   Sepsis Labs Invalid input(s): PROCALCITONIN,  WBC,  LACTICIDVEN Microbiology Recent Results (from the past 240 hour(s))  Resp Panel by RT-PCR (Flu A&B, Covid) Nasopharyngeal Swab     Status: None   Collection Time: 01/22/21  6:50 AM   Specimen: Nasopharyngeal Swab; Nasopharyngeal(NP) swabs in vial transport medium  Result Value Ref Range Status   SARS Coronavirus 2 by RT PCR NEGATIVE NEGATIVE Final    Comment: (NOTE) SARS-CoV-2 target nucleic acids are NOT DETECTED.  The SARS-CoV-2 RNA is generally detectable in upper respiratory specimens during the acute phase of infection. The lowest concentration of SARS-CoV-2 viral copies this assay can detect is 138 copies/mL. A negative result does not preclude SARS-Cov-2 infection and should not be used as the sole basis for treatment or other patient management decisions. A negative result may occur with  improper specimen collection/handling, submission of specimen other than nasopharyngeal swab, presence of viral mutation(s) within the areas targeted by this assay, and inadequate number of viral copies(<138 copies/mL). A negative result  must be combined with clinical observations, patient history, and epidemiological information. The expected result is Negative.  Fact Sheet for Patients:  EntrepreneurPulse.com.au  Fact Sheet for Healthcare Providers:  IncredibleEmployment.be  This test is no t yet approved or cleared by the [place] FDA and  has been authorized for detection and/or diagnosis of SARS-CoV-2 by FDA under an Emergency Use Authorization (EUA). This EUA will remain  in effect (meaning this test can be used) for the duration of the COVID-19 declaration under Section 564(b)(1) of the Act, 21 U.S.C.section 360bbb-3(b)(1), unless the authorization is terminated  or revoked sooner.       Influenza A by PCR NEGATIVE NEGATIVE Final   Influenza B by PCR NEGATIVE NEGATIVE Final    Comment: (NOTE) The Xpert Xpress SARS-CoV-2/FLU/RSV plus assay is intended as an aid in the diagnosis of influenza from Nasopharyngeal swab specimens and should not be used as a sole basis for treatment. Nasal washings and aspirates are unacceptable for Xpert Xpress SARS-CoV-2/FLU/RSV testing.  Fact Sheet for Patients: EntrepreneurPulse.com.au  Fact Sheet for Healthcare Providers: IncredibleEmployment.be  This test is not yet approved or cleared by the [place] FDA and has been authorized for detection and/or diagnosis of SARS-CoV-2 by FDA under an Emergency Use Authorization (EUA). This EUA will remain in effect (meaning this test can be used) for the duration of the COVID-19 declaration under Section 564(b)(1) of the Act, 21 U.S.C. section 360bbb-3(b)(1), unless the authorization is terminated or revoked.  Performed at [facility], [address] [place], [place] [address]    Time coordinating discharge: 35 minutes  SIGNED:  Kerney Elbe, DO [facility] 01/22/2021, 5:08 PM Pager is on [place]  If 7PM-7AM, please  contact night-coverage [email or web address]

## 2021-01-22 NOTE — Progress Notes (Signed)
OT Cancellation Note  Patient Details Name: Darus Hershman MRN: [id number] DOB: 08/20/1940   Cancelled Treatment:    Reason Eval/Treat Not Completed: Patient at procedure or test/ unavailable. Consult received, chart reviewed. Pt getting echo done, PT waiting to evaluate after. Will re-attempt OT evaluation later today when pt is available.   Ardeth Perfect., MPH, MS, OTR/L ascom [phone] 01/22/21, 10:53 AM

## 2021-01-22 NOTE — Consult Note (Signed)
[facility] for warfarin + Lovenox bridge Indication: atrial fibrillation  No Known Allergies  Patient Measurements: Height: 5\' 10"  (177.8 cm) Weight: 98.3 kg (216 lb 11.4 oz) IBW/kg (Calculated) : 73   Vital Signs: Temp: 97.8 F (36.6 C) (10/25 1231) Temp Source: Oral (10/25 1231) BP: 126/74 (10/25 1231) Pulse Rate: 70 (10/25 1231)  Labs: Recent Labs    01/21/21 1515 01/22/21 0650 01/22/21 0912  HGB 16.4  --  16.3  HCT 48.4  --  47.6  PLT 182  --  167  APTT 32  --   --   LABPROT 17.8* 19.3*  --   INR 1.5* 1.6*  --   CREATININE 1.04  --  0.92     Estimated Creatinine Clearance: 76.5 mL/min (by C-G formula based on SCr of 0.92 mg/dL).   Medical History: Past Medical History:  Diagnosis Date   Atrial fibrillation, chronic (HCC)    BPH (benign prostatic hyperplasia)    Dysrhythmia    Afib   Gilbert syndrome    Renal insufficiency     Medications:  PTA med: Warfarin 2 mg Tues/Thurs; 3 mg every other day (TWD = 19)  Assessment: 80 year old male with history of chron atrial fibrillation on warfarin regimen as above, presented with left facial droop, slurred speech. No fibrinolytic given. INR subtherapeutic (1.5) on admission. Pharmacy has been consulted to manage warfarin inpatient. No documented new medications started or missed doses.  DD interactions: none  Date INR Warfarin Dose  10/23 -- 3 mg (PTA) 10/24 1.5 5 mg 10/25 1.6 4 mg  Goal of Therapy:  INR 2-3 Monitor platelets by anticoagulation protocol: Yes   Plan:  Given subtherapeutic INR, start bridge with enoxaparin 97.5 mg (1 mg/kg) q12h.  INR subtherapeutic 1.6 Will give warfarin 4 mg tonight Daily INR CBC per protocol   Tawnya Crook, PharmD, BCPS Clinical Pharmacist 01/22/2021 3:36 PM

## 2021-01-22 NOTE — Progress Notes (Signed)
PROGRESS NOTE    Hunter Klein  KKX:[id number] DOB: 10/10/1940 DOA: 01/21/2021 PCP: Dion Body, MD   Brief Narrative:  The patient is a 80 year old male with a past medical history significant for but not limited to chronic atrial fibrillation, BPH, does arrhythmia, history of Gilbert syndrome, chronic kidney disease stage II as well as other comorbidities who presented to the hospital complaining of a left facial droop and slurred speech.  The patient states he was in normal state of health until 2 PM yesterday when he woke up from nap and started feeling left-sided facial droop.  Per his wife he also had slurred speech and he stated that his right hand felt funny but did not have any significant weakness.  He was still able to walk and did not have any headache or dizziness.  Because of his symptoms he came to the emergency room and on arrival to the emergency room he still had slurred speech but the facial droop resolved.  He had a head CT done which showed a hyperdensity in the MCA distribution which was not able to rule out infarct.  Neurology was consulted and MRI brain, MRA head and neck as well as echocardiogram ordered for further evaluation and work-up patient also need PT OT to further evaluate and treat   Assessment & Plan:   Active Problems:   Chronic atrial fibrillation (HCC)   CVA (cerebral vascular accident) ([place])  Strokelike symptoms with acute CVA of the left brain with hyperdensity in the MCA distribution versus TIA -Presented with a left facial droop and slurred speech -Head CT done and showed "Potential hyperdense right ICA terminus/M1 MCA, although this could be artifactual given streak artifact through this region. CTA could further evaluate for large vessel occlusion eft clinically indicated. No evidence of acute large vascular territory infarct or acute hemorrhage. ASPECTS is 10." -MRI Brain and MRA Head and Neck showed "Faint area of restricted diffusion in the  white matter of the posterior left frontal lobe may represent hyperacute infarct. No intracranial large vessel occlusion. High-grade stenosis at the origin of a left M3/MCA middle division branch. No hemodynamically significant stenosis in the neck. Mild chronic microvascular ischemic changes of the white matter." -Patient is anticoagulated with warfarin and neurology recommending no indication for antiplatelets if warfarin does not need to be held -Echocardiogram has been ordered and done but pending to be read  -Continue to monitor on telemetry and neurochecks per protocol -Lipid panel showed a total cholesterol/HDL ratio of 2.7, cholesterol of 112, HDL 41, LDL 58, triglycerides of 65, VLDL of 13 -Hemoglobin A1c was obtained and was 5.1 -We will need PT OT and SLP to further evaluate and treat -Neurology recommending permissive hypertension from symptom onset until stroke is ruled out by MRI with a goal blood pressure of less than 220/110 -They are recommending as needed labetalol or hydralazine if blood pressure gets above these parameters and recommending avoiding oral antihypertensives and holding home metoprolol for now -Neurology recommending a stat head CT scan for any change in neuro examination -Patient passed bedside EL swallow screen and so will place on a heart healthy diet -Continue with simvastatin 40 mg p.o. nightly -Patient will need an amatory referral to neurology upon discharge  Chronic atrial fibrillation -Imaging as above -Continue with Coumadin per pharmacy dosing -INR was subtherapeutic at 1.6 and likely the cause of an acute CVA -Patient received warfarin 5 mg once -Continue to Monitor   CKD stage II -Patient's BUNs/creatinine was 23/1.04 -Repeat  pending and appears creatinine is at baseline on admission -Avoid further nephrotoxic medications, contrast dyes, hypotension renally dose medications -Repeat CMP in a.m.  Hyperlipidemia -Lipid panel as above -Continue  with Simvastatin 40 g p.o. nightly  Hyperbilirubinemia -Patient's T bili is 2.4 and worsened to 3.2 -Likely reactive  -Continue monitor and trend and repeat CMP in the a.m and if continues to worsen will obtain a RUQ U/S  Hyperglycemia -Likely reactive as patient is not diabetic with a hemoglobin A1c of 5.1 -Continue to monitor blood sugars per protocol and if necessary will place on sensitive duloxetine, insulin  Obesity -Complicates overall prognosis and care -Estimated body mass index is 32.73 kg/m as calculated from the following:   Height as of 09/06/20: 5' 9.5" (1.765 m).   Weight as of this encounter: 102 kg. -Weight Loss and Dietary Counseling given   DVT prophylaxis: Anticoagulated with Coumadin Code Status: FULL CODE  Family Communication: No family present at bedside  Disposition Plan:   Status is: Observation  The patient remains OBS appropriate and will d/c before 2 midnights more than likely.  Consultants:  Neurology  Procedures:  ECHOCARDIOGRAM   Antimicrobials:  Anti-infectives (From admission, onward)    None        Subjective: Seen and examined at bedside and patient thinks he is doing much better and his slurred speech is improved and resolved.  States that when he tried to drink a soda yesterday it ran down the side of his face.  No nausea or vomiting.  Getting his echocardiogram done.  Feels relatively well and still awaiting PT OT to evaluate as well as neurology.  No other concerns or complaints at this time.  Objective: Vitals:   01/22/21 0530 01/22/21 0600 01/22/21 0615 01/22/21 0700  BP: 119/81 136/81  131/88  Pulse: (!) 54  60 (!) 55  Resp:    18  Temp:      TempSrc:      SpO2: 95% 98% 96% 98%  Weight:       No intake or output data in the 24 hours ending 01/22/21 0848 Filed Weights   01/21/21 1531  Weight: 102 kg   Examination: Physical Exam:  Constitutional: WN/WD obese Caucasian male in no acute distress appears calm  Eyes:  Lids and conjunctivae normal, sclerae anicteric  ENMT: External Ears, Nose appear normal. Grossly normal hearing.  Neck: Appears normal, supple, no cervical masses, normal ROM, no appreciable thyromegaly; no appreciable JVD Respiratory: Mildly diminished to auscultation bilaterally, no wheezing, rales, rhonchi or crackles. Normal respiratory effort and patient is not tachypenic. No accessory muscle use.  Unlabored breathing Cardiovascular: RRR, no murmurs / rubs / gallops. S1 and S2 auscultated.  No appreciable extremity edema Abdomen: Soft, non-tender, distended secondary to body habitus. Bowel sounds positive.  GU: Deferred. Musculoskeletal: No clubbing / cyanosis of digits/nails. No joint deformity upper and lower extremities.  Skin: No rashes, lesions, ulcers on limited skin evaluation. No induration; Warm and dry.  Neurologic: CN 2-12 grossly intact with no focal deficits. Romberg sign and cerebellar reflexes not assessed.  Psychiatric: Normal judgment and insight. Alert and oriented x 3. Normal mood and appropriate affect.   Data Reviewed: I have personally reviewed following labs and imaging studies  CBC: Recent Labs  Lab 01/21/21 1515  WBC 6.9  NEUTROABS 4.9  HGB 16.4  HCT 48.4  MCV 97.0  PLT 354   Basic Metabolic Panel: Recent Labs  Lab 01/21/21 1515  NA 141  K 4.2  CL 106  CO2 29  GLUCOSE 109*  BUN 23  CREATININE 1.04  CALCIUM 9.1   GFR: Estimated Creatinine Clearance: 68.3 mL/min (by C-G formula based on SCr of 1.04 mg/dL). Liver Function Tests: Recent Labs  Lab 01/21/21 1515  AST 22  ALT 14  ALKPHOS 79  BILITOT 2.4*  PROT 7.3  ALBUMIN 4.2   No results for input(s): LIPASE, AMYLASE in the last 168 hours. No results for input(s): AMMONIA in the last 168 hours. Coagulation Profile: Recent Labs  Lab 01/21/21 1515 01/22/21 0650  INR 1.5* 1.6*   Cardiac Enzymes: No results for input(s): CKTOTAL, CKMB, CKMBINDEX, TROPONINI in the last 168 hours. BNP  (last 3 results) No results for input(s): PROBNP in the last 8760 hours. HbA1C: Recent Labs    01/21/21 1515  HGBA1C 5.1   CBG: Recent Labs  Lab 01/21/21 1458  GLUCAP 136*   Lipid Profile: Recent Labs    01/21/21 1515 01/22/21 0650  CHOL  --  112  HDL  --  41  LDLCALC  --  58  TRIG  --  65  CHOLHDL  --  2.7  LDLDIRECT 62.7  --    Thyroid Function Tests: No results for input(s): TSH, T4TOTAL, FREET4, T3FREE, THYROIDAB in the last 72 hours. Anemia Panel: No results for input(s): VITAMINB12, FOLATE, FERRITIN, TIBC, IRON, RETICCTPCT in the last 72 hours. Sepsis Labs: No results for input(s): PROCALCITON, LATICACIDVEN in the last 168 hours.  Recent Results (from the past 240 hour(s))  Resp Panel by RT-PCR (Flu A&B, Covid) Nasopharyngeal Swab     Status: None   Collection Time: 01/22/21  6:50 AM   Specimen: Nasopharyngeal Swab; Nasopharyngeal(NP) swabs in vial transport medium  Result Value Ref Range Status   SARS Coronavirus 2 by RT PCR NEGATIVE NEGATIVE Final    Comment: (NOTE) SARS-CoV-2 target nucleic acids are NOT DETECTED.  The SARS-CoV-2 RNA is generally detectable in upper respiratory specimens during the acute phase of infection. The lowest concentration of SARS-CoV-2 viral copies this assay can detect is 138 copies/mL. A negative result does not preclude SARS-Cov-2 infection and should not be used as the sole basis for treatment or other patient management decisions. A negative result may occur with  improper specimen collection/handling, submission of specimen other than nasopharyngeal swab, presence of viral mutation(s) within the areas targeted by this assay, and inadequate number of viral copies(<138 copies/mL). A negative result must be combined with clinical observations, patient history, and epidemiological information. The expected result is Negative.  Fact Sheet for Patients:  EntrepreneurPulse.com.au  Fact Sheet for Healthcare  Providers:  IncredibleEmployment.be  This test is no t yet approved or cleared by the [place] FDA and  has been authorized for detection and/or diagnosis of SARS-CoV-2 by FDA under an Emergency Use Authorization (EUA). This EUA will remain  in effect (meaning this test can be used) for the duration of the COVID-19 declaration under Section 564(b)(1) of the Act, 21 U.S.C.section 360bbb-3(b)(1), unless the authorization is terminated  or revoked sooner.       Influenza A by PCR NEGATIVE NEGATIVE Final   Influenza B by PCR NEGATIVE NEGATIVE Final    Comment: (NOTE) The Xpert Xpress SARS-CoV-2/FLU/RSV plus assay is intended as an aid in the diagnosis of influenza from Nasopharyngeal swab specimens and should not be used as a sole basis for treatment. Nasal washings and aspirates are unacceptable for Xpert Xpress SARS-CoV-2/FLU/RSV testing.  Fact Sheet for Patients: EntrepreneurPulse.com.au  Fact Sheet for Healthcare  Providers: IncredibleEmployment.be  This test is not yet approved or cleared by the [place] and has been authorized for detection and/or diagnosis of SARS-CoV-2 by FDA under an Emergency Use Authorization (EUA). This EUA will remain in effect (meaning this test can be used) for the duration of the COVID-19 declaration under Section 564(b)(1) of the Act, 21 U.S.C. section 360bbb-3(b)(1), unless the authorization is terminated or revoked.  Performed at [facility], [address] [place], [place] [address]     RN Pressure Injury Documentation:     Estimated body mass index is 32.73 kg/m as calculated from the following:   Height as of 09/06/20: 5' 9.5" (1.765 m).   Weight as of this encounter: 102 kg.  Malnutrition Type:   Malnutrition Characteristics:   Nutrition Interventions:   Radiology Studies: MR ANGIO HEAD WO CONTRAST  Result Date: 01/21/2021 CLINICAL DATA:  Neuro  deficit, acute, stroke suspected. EXAM: MRI HEAD WITHOUT CONTRAST MRA HEAD WITHOUT CONTRAST MRA NECK WITHOUT CONTRAST TECHNIQUE: Multiplanar, multi-echo pulse sequences of the brain and surrounding structures were acquired without intravenous contrast. Angiographic images of the Circle of Willis were acquired using MRA technique without intravenous contrast. Angiographic images of the neck were acquired using MRA technique without intravenous contrast. Carotid stenosis measurements (when applicable) are obtained utilizing NASCET criteria, using the distal internal carotid diameter as the denominator. COMPARISON:  Head CT January 21, 2021. FINDINGS: MRI HEAD FINDINGS Brain: Focal area of restricted diffusion within the white matter of the posterior left frontal lobe without corresponding T2 hyperintensity. No hemorrhage, hydrocephalus, extra-axial collection or mass lesion. Small amount of scattered foci of T2 hyperintensity are seen within the white matter of the cerebral hemispheres and within the pons, nonspecific, most likely related to chronic small vessel ischemia. Vascular: Normal flow voids. Skull and upper cervical spine: Normal marrow signal. Sinuses/Orbits: Bilateral lens surgery. Paranasal sinuses are clear. Other: None. MRA HEAD FINDINGS The study is partially degraded by motion. Anterior circulation: Normal flow related enhancement of the intracranial carotid arteries. A 3 mm medially projecting outpouching from the cavernous segment of the right ICA, consistent with small aneurysm. High-grade stenosis is seen at the origin of a left M3/MCA middle division branch, suggestive of intracranial atherosclerotic disease. No large vessel occlusion identified in the anterior circulation. Normal flow related enhancement in the right MCA and bilateral ACA vascular tree. Posterior circulation: Normal flow related enhancement of the intracranial vertebral arteries, basilar artery and bilateral posterior cerebral  arteries in, noting origin of the right posterior cerebral artery directly from the right ICA (fetal PCA). Anatomic variants: Right fetal PCA. MRA NECK FINDINGS Normal course and caliber of the visualized portions of the bilateral common carotid arteries, carotid bifurcation and cervical internal carotid arteries. No hemodynamically significant stenosis seen. Normal caliber and flow related enhancement of the visualized V2 segment of the bilateral vertebral arteries. IMPRESSION: 1. Faint area of restricted diffusion in the white matter of the posterior left frontal lobe may represent hyperacute infarct. 2. No intracranial large vessel occlusion. 3. High-grade stenosis at the origin of a left M3/MCA middle division branch. 4. No hemodynamically significant stenosis in the neck. 5. 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 These results were called by telephone at the time of interpretation on 01/21/2021 at 4:45 pm to provider Su Monks, MD , who verbally acknowledged these results. Electronically Signed   By: Pedro Earls M.D.   On: 01/21/2021 16:45   MR ANGIO NECK WO CONTRAST  Result Date: 01/21/2021 CLINICAL DATA:  Neuro deficit, acute, stroke suspected. EXAM: MRI HEAD WITHOUT CONTRAST MRA HEAD WITHOUT CONTRAST MRA NECK WITHOUT CONTRAST TECHNIQUE: Multiplanar, multi-echo pulse sequences of the brain and surrounding structures were acquired without intravenous contrast. Angiographic images of the Circle of Willis were acquired using MRA technique without intravenous contrast. Angiographic images of the neck were acquired using MRA technique without intravenous contrast. Carotid stenosis measurements (when applicable) are obtained utilizing NASCET criteria, using the distal internal carotid diameter as the denominator. COMPARISON:  Head CT January 21, 2021. FINDINGS: MRI HEAD FINDINGS Brain: Focal area of restricted diffusion within the white matter of the posterior  left frontal lobe without corresponding T2 hyperintensity. No hemorrhage, hydrocephalus, extra-axial collection or mass lesion. Small amount of scattered foci of T2 hyperintensity are seen within the white matter of the cerebral hemispheres and within the pons, nonspecific, most likely related to chronic small vessel ischemia. Vascular: Normal flow voids. Skull and upper cervical spine: Normal marrow signal. Sinuses/Orbits: Bilateral lens surgery. Paranasal sinuses are clear. Other: None. MRA HEAD FINDINGS The study is partially degraded by motion. Anterior circulation: Normal flow related enhancement of the intracranial carotid arteries. A 3 mm medially projecting outpouching from the cavernous segment of the right ICA, consistent with small aneurysm. High-grade stenosis is seen at the origin of a left M3/MCA middle division branch, suggestive of intracranial atherosclerotic disease. No large vessel occlusion identified in the anterior circulation. Normal flow related enhancement in the right MCA and bilateral ACA vascular tree. Posterior circulation: Normal flow related enhancement of the intracranial vertebral arteries, basilar artery and bilateral posterior cerebral arteries in, noting origin of the right posterior cerebral artery directly from the right ICA (fetal PCA). Anatomic variants: Right fetal PCA. MRA NECK FINDINGS Normal course and caliber of the visualized portions of the bilateral common carotid arteries, carotid bifurcation and cervical internal carotid arteries. No hemodynamically significant stenosis seen. Normal caliber and flow related enhancement of the visualized V2 segment of the bilateral vertebral arteries. IMPRESSION: 1. Faint area of restricted diffusion in the white matter of the posterior left frontal lobe may represent hyperacute infarct. 2. No intracranial large vessel occlusion. 3. High-grade stenosis at the origin of a left M3/MCA middle division branch. 4. No hemodynamically  significant stenosis in the neck. 5. 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 These results were called by telephone at the time of interpretation on 01/21/2021 at 4:45 pm to provider Su Monks, MD , who verbally acknowledged these results. Electronically Signed   By: Pedro Earls M.D.   On: 01/21/2021 16:45   MR BRAIN WO CONTRAST  Result Date: 01/21/2021 CLINICAL DATA:  Neuro deficit, acute, stroke suspected. EXAM: MRI HEAD WITHOUT CONTRAST MRA HEAD WITHOUT CONTRAST MRA NECK WITHOUT CONTRAST TECHNIQUE: Multiplanar, multi-echo pulse sequences of the brain and surrounding structures were acquired without intravenous contrast. Angiographic images of the Circle of Willis were acquired using MRA technique without intravenous contrast. Angiographic images of the neck were acquired using MRA technique without intravenous contrast. Carotid stenosis measurements (when applicable) are obtained utilizing NASCET criteria, using the distal internal carotid diameter as the denominator. COMPARISON:  Head CT January 21, 2021. FINDINGS: MRI HEAD FINDINGS Brain: Focal area of restricted diffusion within the white matter of the posterior left frontal lobe without corresponding T2 hyperintensity. No hemorrhage, hydrocephalus, extra-axial collection or mass lesion. Small amount of scattered foci of T2 hyperintensity are seen within the white matter of the cerebral hemispheres and within the  pons, nonspecific, most likely related to chronic small vessel ischemia. Vascular: Normal flow voids. Skull and upper cervical spine: Normal marrow signal. Sinuses/Orbits: Bilateral lens surgery. Paranasal sinuses are clear. Other: None. MRA HEAD FINDINGS The study is partially degraded by motion. Anterior circulation: Normal flow related enhancement of the intracranial carotid arteries. A 3 mm medially projecting outpouching from the cavernous segment of the right ICA, consistent with small  aneurysm. High-grade stenosis is seen at the origin of a left M3/MCA middle division branch, suggestive of intracranial atherosclerotic disease. No large vessel occlusion identified in the anterior circulation. Normal flow related enhancement in the right MCA and bilateral ACA vascular tree. Posterior circulation: Normal flow related enhancement of the intracranial vertebral arteries, basilar artery and bilateral posterior cerebral arteries in, noting origin of the right posterior cerebral artery directly from the right ICA (fetal PCA). Anatomic variants: Right fetal PCA. MRA NECK FINDINGS Normal course and caliber of the visualized portions of the bilateral common carotid arteries, carotid bifurcation and cervical internal carotid arteries. No hemodynamically significant stenosis seen. Normal caliber and flow related enhancement of the visualized V2 segment of the bilateral vertebral arteries. IMPRESSION: 1. Faint area of restricted diffusion in the white matter of the posterior left frontal lobe may represent hyperacute infarct. 2. No intracranial large vessel occlusion. 3. High-grade stenosis at the origin of a left M3/MCA middle division branch. 4. No hemodynamically significant stenosis in the neck. 5. 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 These results were called by telephone at the time of interpretation on 01/21/2021 at 4:45 pm to provider Su Monks, MD , who verbally acknowledged these results. Electronically Signed   By: Pedro Earls M.D.   On: 01/21/2021 16:45   CT HEAD CODE STROKE WO CONTRAST  Result Date: 01/21/2021 CLINICAL DATA:  Code stroke.  Neuro deficit, acute, stroke suspected EXAM: CT HEAD WITHOUT CONTRAST TECHNIQUE: Contiguous axial images were obtained from the base of the skull through the vertex without intravenous contrast. COMPARISON:  None. FINDINGS: Brain: No evidence of acute large vascular territory infarction, hemorrhage,  hydrocephalus, extra-axial collection or mass lesion/mass effect. Patchy white matter hypoattenuation, nonspecific but compatible with chronic microvascular ischemic disease. Atrophy with ex vacuo ventricular dilation. Vascular: Potential hyperdense right ICA terminus/M1 MCA, although this could be artifatual given streak artifact through this region. Calcific intracranial atherosclerosis. Skull: No acute fracture. Sinuses/Orbits: Visualized sinuses are clear. No acute orbital findings. Other: No mastoid effusions. ASPECTS [facility] Stroke Program Early CT Score) total score (0-10 with 10 being normal): 10 IMPRESSION: 1. Potential hyperdense right ICA terminus/M1 MCA, although this could be artifactual given streak artifact through this region. CTA could further evaluate for large vessel occlusion eft clinically indicated. 2. No evidence of acute large vascular territory infarct or acute hemorrhage. ASPECTS is 10. Electronically Signed   By: Margaretha Sheffield M.D.   On: 01/21/2021 15:21    Scheduled Meds:   stroke: mapping our early stages of recovery book   Does not apply Once   loratadine  10 mg Oral Daily   simvastatin  40 mg Oral QHS   Warfarin - Pharmacist Dosing Inpatient   Does not apply q1600   Continuous Infusions:   LOS: 0 days   Kerney Elbe, DO [facility] PAGER is on [place]  If 7PM-7AM, please contact night-coverage [email or web address]

## 2021-01-22 NOTE — ED Notes (Signed)
This RN spoke with this pt's wife, Sharee Pimple, and updated her on this pt's condition. This RN also updated her that this pt has room upstairs and will be in room 124 when she comes to see this pt later. This pt informed of wife's expected arrival later this afternoon.

## 2021-01-22 NOTE — Progress Notes (Signed)
Patient being discharged home. Removed IV before discharge. Went over discharge instructions with patient and patients wife. Gave both instructions on how to administer Lovenox. All questions were answered and both agreed they understood. Patient going home with wife POV.

## 2021-01-22 NOTE — Progress Notes (Signed)
SLP Cancellation Note  Patient Details Name: Hunter Klein MRN: [id number] DOB: 15-Jan-1941   Cancelled treatment:       Reason Eval/Treat Not Completed: Other (comment)  SLP consult received and appreciated. Chart review completed. Pt currently OTF for echocardiogram. Will continue efforts, as appropriate.   Clearnce Sorrel [name] 01/22/2021, 9:01 AM

## 2021-01-22 NOTE — Consult Note (Addendum)
ANTICOAGULATION CONSULT NOTE - Initial Consult  Pharmacy Consult for warfarin Indication: atrial fibrillation  No Known Allergies  Patient Measurements: Height: 5\' 10"  (177.8 cm) Weight: 98.3 kg (216 lb 11.4 oz) IBW/kg (Calculated) : 73   Vital Signs: Temp: 97.8 F (36.6 C) (10/25 0848) Temp Source: Oral (10/25 0848) BP: 142/98 (10/25 0848) Pulse Rate: 66 (10/25 0848)  Labs: Recent Labs    01/21/21 1515 01/22/21 0650  HGB 16.4  --   HCT 48.4  --   PLT 182  --   APTT 32  --   LABPROT 17.8* 19.3*  INR 1.5* 1.6*  CREATININE 1.04  --      Estimated Creatinine Clearance: 67.7 mL/min (by C-G formula based on SCr of 1.04 mg/dL).   Medical History: Past Medical History:  Diagnosis Date   Atrial fibrillation, chronic (HCC)    BPH (benign prostatic hyperplasia)    Dysrhythmia    Afib   Gilbert syndrome    Renal insufficiency     Medications:  PTA med: Warfarin 2 mg Tues/Thurs; 3 mg every other day (TWD = 19)  Assessment: 80 year old male with history of chron atrial fibrillation on warfarin regimen as above, presented with left facial droop, slurred speech. No fibrinolytic given. INR subtherapeutic (1.5) on admission. Pharmacy has been consulted to manage warfarin inpatient. No documented new medications started or missed doses.  DD interactions: none  Date INR Warfarin Dose  10/23 -- 3 mg (PTA) 10/24 1.5 5 mg 10/25 1.6 4 mg  Goal of Therapy:  INR 2-3 Monitor platelets by anticoagulation protocol: Yes   Plan:  INR subtherapeutic 1.6 Will give warfarin 4 mg tonight Daily INR CBC per protocol   Sherilyn Banker, PharmD Clinical Pharmacist   01/22/2021,9:32 AM

## 2021-01-22 NOTE — Evaluation (Addendum)
Physical Therapy Evaluation Patient Details Name: Hunter Klein MRN: [id number] DOB: 10-14-1940 Today's Date: 01/22/2021  History of Present Illness  Patient is a 80 year old male with history of chron atrial fibrillation, benign prostate hypertrophy, Gilbert syndrome, chronic kidney disease stage II. Presented to hospital with left facial droop, slurred speech. Stroke work up ongoing.  Clinical Impression  Patient agreeable to PT evaluation, able to follow multi step commands without difficulty. Patient previously independent with mobility and lives at home with his spouse.  Patient currently is Mod I or independent with all activity. He ambulated a lap in hallway without assistive device and no loss of balance. 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 No significant focal weakness is noted in extremities. The patient does complain of intermittent difficulty with word finding and occasional slurring of his speech. This was not noted during exam. Patient appears to be at his baseline level of functional mobility with no further acute PT needs at this time.      Recommendations for follow up therapy are one component of a multi-disciplinary discharge planning process, led by the attending physician.  Recommendations may be updated based on patient status, additional functional criteria and insurance authorization.  Follow Up Recommendations No PT follow up    Assistance Recommended at Discharge None  Functional Status Assessment Patient has not had a recent decline in their functional status  Equipment Recommendations  None recommended by PT    Recommendations for Other Services       Precautions / Restrictions Precautions Precautions: Fall Restrictions Weight Bearing Restrictions: No      Mobility  Bed Mobility Overal bed mobility: Independent                  Transfers Overall transfer level: Modified independent                  General transfer comment: increased time required    Ambulation/Gait Ambulation/Gait assistance: Independent Gait Distance (Feet): 175 Feet Assistive device: None Gait Pattern/deviations: Step-through pattern;Knee flexed in stance - right Gait velocity: normal   General Gait Details: patient has a chroninc leg length discrepancy and usually wears a lift in his shoe. no loss of balance without heel lift.   Stairs            Wheelchair Mobility    Modified Rankin (Stroke Patients Only)       Balance Overall balance assessment: Needs assistance Sitting-balance support: Feet supported Sitting balance-Leahy Scale: Normal     Standing balance support: No upper extremity supported Standing balance-Leahy Scale: Good                               Pertinent Vitals/Pain Pain Assessment: No/denies pain    Home Living Family/patient expects to be discharged to:: Private residence Living Arrangements: Spouse/significant other Available Help at Discharge: Family Type of Home: House                  Prior Function Prior Level of Function : Independent/Modified Independent;Driving             Mobility Comments: independent without device ADLs Comments: independent     Hand Dominance   Dominant Hand: Right    Extremity/Trunk Assessment   Upper Extremity Assessment Upper Extremity Assessment: RUE deficits/detail;LUE deficits/detail RUE Deficits / Details: shoulder flexion 4+5, elbow flexion/extension 5/5. good grip strength RUE Sensation: WNL  RUE Coordination: WNL (rapid alternating movement without dysmetria) LUE Deficits / Details: shoulder flexion 4+5, elbow flexion/extension 5/5. good grip strength LUE Sensation: WNL LUE Coordination: WNL (rapid alternating movement without dysmetria)    Lower Extremity Assessment Lower Extremity Assessment: RLE deficits/detail;LLE deficits/detail RLE Deficits / Details: 5/5 strength hip  add/abd, knee extension, dorsiflexion/plantarflextion RLE Sensation: WNL RLE Coordination: WNL LLE Deficits / Details: 5/5 strength hip add/abd, knee extension, dorsiflexion/plantarflextion LLE Sensation: WNL LLE Coordination: WNL       Communication   Communication: Expressive difficulties;Other (comment) (patient reports intermittent difficulty word finding and slurred speech at times that has improved since yesterday. this was not noted by therapist during my assessment)  Cognition Arousal/Alertness: Awake/alert Behavior During Therapy: WFL for tasks assessed/performed Overall Cognitive Status: Within Functional Limits for tasks assessed                                 General Comments: patient able to follow multi step commands without difficulty        General Comments  Education provided on general safety during mobility. Patient has good safety awareness and should continue routine ambulation to maintain strength and conditioning.     Exercises     Assessment/Plan    PT Assessment Patient does not need any further PT services  PT Problem List         PT Treatment Interventions      PT Goals (Current goals can be found in the Care Plan section)  Acute Rehab PT Goals Patient Stated Goal: to have improvement in speech PT Goal Formulation: With patient Time For Goal Achievement: 01/22/21 Potential to Achieve Goals: Good    Frequency     Barriers to discharge        Co-evaluation               AM-PAC PT "6 Clicks" Mobility  Outcome Measure Help needed turning from your back to your side while in a flat bed without using bedrails?: None Help needed moving from lying on your back to sitting on the side of a flat bed without using bedrails?: None Help needed moving to and from a bed to a chair (including a wheelchair)?: None Help needed standing up from a chair using your arms (e.g., wheelchair or bedside chair)?: None Help needed to walk in  hospital room?: None Help needed climbing 3-5 steps with a railing? : None 6 Click Score: 24    End of Session Equipment Utilized During Treatment: Gait belt Activity Tolerance: Patient tolerated treatment well Patient left: in bed;with call bell/phone within reach;with SCD's reapplied;with bed alarm set Nurse Communication: Mobility status PT Visit Diagnosis: Muscle weakness (generalized) (M62.81)    Time: 6381-7711 PT Time Calculation (min) (ACUTE ONLY): 18 min   Charges:   PT Evaluation $PT Eval Low Complexity: 1 Low PT Treatments $Therapeutic Activity: 8-22 mins       Minna Merritts, PT, MPT  Percell Locus 01/22/2021, 10:54 AM

## 2021-01-22 NOTE — Progress Notes (Signed)
OT Screen Note  Patient Details Name: Hunter Klein MRN: [id number] DOB: 1941-01-17   Cancelled Treatment:    Reason Eval/Treat Not Completed: OT screened, no needs identified, will sign off. Order received, chart reviewed. Pt back to baseline functional independence. Only endorsing PRN word finding difficulty. No issues with ADL, reading fine print, or with mobility. No skilled OT needs identified. Will sign off. Please re-consult if additional needs arise.   Ardeth Perfect., MPH, MS, OTR/L ascom [phone] 01/22/21, 2:03 PM

## 2021-01-22 NOTE — Progress Notes (Signed)
*  PRELIMINARY RESULTS* Echocardiogram 2D Echocardiogram has been performed.  Hunter Klein 01/22/2021, 10:21 AM

## 2021-01-22 NOTE — Evaluation (Signed)
Speech Language Pathology Evaluation Patient Details Name: Hunter Klein MRN: [id number] DOB: 05-25-1940 Today's Date: 01/22/2021 Time: 4098-1191 SLP Time Calculation (min) (ACUTE ONLY): 30 min  Problem List:  Patient Active Problem List   Diagnosis Date Noted   CVA (cerebral vascular accident) ([place]) 01/21/2021   Gilbert's syndrome 07/18/2019   History of colon polyps 07/18/2019   Renal insufficiency 07/18/2019   Class 1 obesity due to excess calories with serious comorbidity and body mass index (BMI) of 32.0 to 32.9 in adult 01/14/2018   Encounter for general adult medical examination without abnormal findings 06/17/2016   Vaccine counseling 06/17/2016   Benign prostatic hyperplasia with weak urinary stream 12/19/2015   Chronic atrial fibrillation ([place]) 12/19/2015   Pure hypercholesterolemia 12/19/2015   Past Medical History:  Past Medical History:  Diagnosis Date   Atrial fibrillation, chronic (HCC)    BPH (benign prostatic hyperplasia)    Dysrhythmia    Afib   Rosanna Randy syndrome    Renal insufficiency    Past Surgical History:  Past Surgical History:  Procedure Laterality Date   COLONOSCOPY N/A 01/11/2020   Procedure: COLONOSCOPY;  Surgeon: Toledo, Benay Pike, MD;  Location: [facility] ENDOSCOPY;  Service: Gastroenterology;  Laterality: N/A;   TONSILLECTOMY     HPI:      Assessment / Plan / Recommendation Clinical Impression  Pt seen for cognitive-linguistic evaluation. Pt presents with cognitive-linguistic deficits affecting executive functioning/higher level problem solving, spatial orientation [facility]"), and complex expression. Pt's speech is mainly fluent with occasional paucity of speech/anomia with emerging use of circumlocution/synonym use to repair communication breakdowns. Pt also with s/sx dysarthria c/b minimal articulatory imprecision which perceptually is appreciated most in connected speech; however, pt's speech is 100% intelligible. Pt benefited from  slowed speech rate to improve speech clarity. Pt and wife endorse pt not at baseline for cognitive-linguistic functioning.   Pt and wife educated re: role of SLP, results of assessment, POC, and d/c recommendations. Pt and wife verbalized understanding/agreement. Introduced compensatory strategies for improved functional cognitive-linguistic ability. Reinforcement of content by SLP may be needed.   Based on today's assessment, anticipate need for 24 hour supervision/assistance and post-acute SLP services for cognitive-linguistic deficits. SLP to f/u per POC for cognitive-linguistic tx while pt in house.    SLP Assessment  SLP Recommendation/Assessment: Patient needs continued Speech [place] [facility] SLP Visit Diagnosis: Cognitive communication deficit (R41.841)    Recommendations for follow up therapy are one component of a multi-disciplinary discharge planning process, led by the attending physician.  Recommendations may be updated based on patient status, additional functional criteria and insurance authorization.    Follow Up Recommendations  24 hour supervision/assistance;Outpatient SLP    Frequency and Duration min 2x/week  2 weeks      SLP Evaluation Cognition  Overall Cognitive Status: Impaired/Different from baseline Arousal/Alertness: Awake/alert Orientation Level: Disoriented to place [facility]") Attention: Focused;Sustained Memory: Appears intact Awareness: Appears intact Problem Solving:  (verbal WFL) Executive Function: Self Monitoring (errors in numbers on clockdrawing task; able to correct once errors identified by SLP) Safety/Judgment: Appears intact       Comprehension  Auditory Comprehension Overall Auditory Comprehension: Appears within functional limits for tasks assessed Yes/No Questions: Within Functional Limits Commands: Within Functional Limits Conversation: Simple Visual Recognition/Discrimination Discrimination: Within Function  Limits Reading Comprehension Reading Status:  (DNT)    Expression Expression Primary Mode of Expression: Verbal Verbal Expression Overall Verbal Expression: Impaired Initiation: No impairment Level of Generative/Spontaneous Verbalization: Conversation Repetition: No impairment Naming: Impairment Responsive: 76-100%  accurate Confrontation:  [facility]; extra time) Convergent: 75-100% accurate Divergent: 75-100% accurate Written Expression Dominant Hand: Right Written Expression: Within Functional Limits (for clockdrawing)   Oral / Motor  Oral Motor/Sensory Function Overall Oral Motor/Sensory Function: Mild impairment Facial ROM: Reduced right Facial Symmetry: Abnormal symmetry right Facial Sensation: Reduced right Lingual ROM: Within Functional Limits Lingual Symmetry: Within Functional Limits Lingual Strength: Within Functional Limits Lingual Sensation: Within Functional Limits Velum: Within Functional Limits Mandible: Within Functional Limits Motor Speech Overall Motor Speech: Impaired Respiration: Within functional limits Phonation: Normal Resonance: Within functional limits Articulation: Impaired Level of Impairment: Conversation (connected speech; mild articulatory imprecision; 100% intelligible) Intelligibility: Intelligible Motor Planning: Witnin functional limits Effective Techniques: Slow rate            Cherrie Gauze, M.S., CCC-SLP             Hunter Klein 01/22/2021, 2:12 PM

## 2021-01-30 DIAGNOSIS — Z8673 Personal history of transient ischemic attack (TIA), and cerebral infarction without residual deficits: Secondary | ICD-10-CM | POA: Insufficient documentation

## 2021-04-10 ENCOUNTER — Ambulatory Visit: Payer: Medicare Other | Admitting: Diagnostic Neuroimaging

## 2021-05-31 ENCOUNTER — Ambulatory Visit (INDEPENDENT_AMBULATORY_CARE_PROVIDER_SITE_OTHER): Payer: Medicare Other | Admitting: Diagnostic Neuroimaging

## 2021-05-31 ENCOUNTER — Other Ambulatory Visit: Payer: Self-pay

## 2021-05-31 ENCOUNTER — Encounter: Payer: Self-pay | Admitting: Diagnostic Neuroimaging

## 2021-05-31 VITALS — BP 118/68 | HR 70 | Ht 70.0 in | Wt 225.0 lb

## 2021-05-31 DIAGNOSIS — E7800 Pure hypercholesterolemia, unspecified: Secondary | ICD-10-CM

## 2021-05-31 DIAGNOSIS — I482 Chronic atrial fibrillation, unspecified: Secondary | ICD-10-CM | POA: Diagnosis not present

## 2021-05-31 DIAGNOSIS — I6930 Unspecified sequelae of cerebral infarction: Secondary | ICD-10-CM

## 2021-05-31 NOTE — Patient Instructions (Signed)
?  LEFT MCA STROKE (subtherapeutic INR; chronic atrial fibrillation) ? ?- consider changing warfarin to eliquis 5mg  twice a day  ? ?- continue statin and BP control ?

## 2021-05-31 NOTE — Progress Notes (Signed)
[facility]  PATIENT: Hunter Klein DOB: [birth year]  REFERRING CLINICIAN: Kerney Elbe, DO HISTORY FROM: patient  REASON FOR VISIT:  new consult    HISTORICAL  CHIEF COMPLAINT:  Chief Complaint  Patient presents with   New Patient (Initial Visit)    RM 3 with wife- here for f/u on CVA- Pt reports event took place on 01/21/2021 went to [facility] feels like he has been doing well.     HISTORY OF PRESENT ILLNESS:   80 year old male here for evaluation of stroke.  History of atrial fibrillation, hypertension, hyperlipidemia.  Patient has been on chronic anticoagulation for atrial fibrillation for past 20 years.  In September 2022 he was supratherapeutic on INR.  Coumadin level was adjusted and that he became subtherapeutic on INR in October 2022.  On 01/21/2021 patient noticed water was falling out of his mouth when he was drinking it.  He was also having trouble getting his words out.  Stroke symptoms were identified as the patient was in the hospital for evaluation.  He was diagnosed with subtherapeutic INR and left MCA stroke.  Thrombolytics were not administered due to rapidly improving symptoms and chronic anticoagulation use.  Stroke work-up was completed.  He was bridged using Lovenox and Coumadin dose was adjusted.  's at the time symptoms have resolved.  No residual speech or weakness issues.   REVIEW OF SYSTEMS: Full 14 system review of systems performed and negative with exception of: as per HPI.  ALLERGIES: No Known Allergies  HOME MEDICATIONS: Outpatient Medications Prior to Visit  Medication Sig Dispense Refill   acetaminophen (TYLENOL) 500 MG tablet Take by mouth.     latanoprost (XALATAN) 0.005 % ophthalmic solution Place 1 drop into the right eye at bedtime.     lisinopril (ZESTRIL) 2.5 MG tablet Take 2.5 mg by mouth at bedtime.     loratadine (CLARITIN) 10 MG tablet Take by mouth.     metoprolol succinate (TOPROL-XL) 50 MG 24 hr tablet  Take 50 mg by mouth daily.     simvastatin (ZOCOR) 40 MG tablet Take 40 mg by mouth at bedtime.     enoxaparin (LOVENOX) 100 MG/ML injection Inject 1 mL (100 mg total) into the skin every 12 (twelve) hours for 5 days. 10 mL 0   warfarin (COUMADIN) 3 MG tablet Take 1 tablet (3 mg total) by mouth daily. Pt taking 2 mg tuesdays and thursdays. Taking 3 mg mondays, wednesdays, fridays, saturdays, and sundays (Patient taking differently: Take 3 mg by mouth daily. 1 tablet daily) 30 tablet 0   No facility-administered medications prior to visit.    PAST MEDICAL HISTORY: Past Medical History:  Diagnosis Date   Atrial fibrillation, chronic (HCC)    BPH (benign prostatic hyperplasia)    Dysrhythmia    Afib   Rosanna Randy syndrome    Renal insufficiency     PAST SURGICAL HISTORY: Past Surgical History:  Procedure Laterality Date   COLONOSCOPY N/A 01/11/2020   Procedure: COLONOSCOPY;  Surgeon: Toledo, Benay Pike, MD;  Location: [facility] ENDOSCOPY;  Service: Gastroenterology;  Laterality: N/A;   TONSILLECTOMY      FAMILY HISTORY: No family history on file.  SOCIAL HISTORY: Social History   Socioeconomic History   Marital status: Married    Spouse name: Not on file   Number of children: Not on file   Years of education: Not on file   Highest education level: Not on file  Occupational History   Not on file  Tobacco  Use   Smoking status: Never   Smokeless tobacco: Never  Vaping Use   Vaping Use: Never used  Substance and Sexual Activity   Alcohol use: Not Currently   Drug use: Never   Sexual activity: Yes    Birth control/protection: None  Other Topics Concern   Not on file  Social History Narrative   Not on file   Social Determinants of Health   Financial Resource Strain: Not on file  Food Insecurity: Not on file  Transportation Needs: Not on file  Physical Activity: Not on file  Stress: Not on file  Social Connections: Not on file  Intimate Partner Violence: Not on file      PHYSICAL EXAM  GENERAL EXAM/CONSTITUTIONAL: Vitals:  Vitals:   05/31/21 1005  BP: 118/68  Pulse: 70  Weight: 225 lb (102.1 kg)  Height: 5\' 10"  (1.778 m)   Body mass index is 32.28 kg/m. Wt Readings from Last 3 Encounters:  05/31/21 225 lb (102.1 kg)  01/22/21 216 lb 11.4 oz (98.3 kg)  09/06/20 218 lb (98.9 kg)   Patient is in no distress; well developed, nourished and groomed; neck is supple  CARDIOVASCULAR: Examination of carotid arteries is normal; no carotid bruits SLIGHTLY IRREGULAR; no murmurs Examination of peripheral vascular system by observation and palpation is normal  EYES: Ophthalmoscopic exam of optic discs and posterior segments is normal; no papilledema or hemorrhages No results found.  MUSCULOSKELETAL: Gait, strength, tone, movements noted in Neurologic exam below  NEUROLOGIC: MENTAL STATUS:  No flowsheet data found. awake, alert, oriented to person, place and time recent and remote memory intact normal attention and concentration language fluent, comprehension intact, naming intact fund of knowledge appropriate  CRANIAL NERVE:  2nd - no papilledema on fundoscopic exam 2nd, 3rd, 4th, 6th - pupils equal and reactive to light, visual fields full to confrontation, extraocular muscles intact, no nystagmus 5th - facial sensation symmetric 7th - facial strength --> SLIGHT DECR RIGHT NL FOLD 8th - hearing intact 9th - palate elevates symmetrically, uvula midline 11th - shoulder shrug symmetric 12th - tongue protrusion midline  MOTOR:  normal bulk and tone, full strength in the BUE, BLE  SENSORY:  normal and symmetric to light touch, temperature, vibration  COORDINATION:  finger-nose-finger, fine finger movements normal  REFLEXES:  deep tendon reflexes TRACE and symmetric  GAIT/STATION:  narrow based gait    DIAGNOSTIC DATA (LABS, IMAGING, TESTING) - I reviewed patient records, labs, notes, testing and imaging myself where  available.  Lab Results  Component Value Date   WBC 6.2 01/22/2021   HGB 16.3 01/22/2021   HCT 47.6 01/22/2021   MCV 96.0 01/22/2021   PLT 167 01/22/2021      Component Value Date/Time   NA 138 01/22/2021 0912   K 4.5 01/22/2021 0912   CL 105 01/22/2021 0912   CO2 27 01/22/2021 0912   GLUCOSE 101 (H) 01/22/2021 0912   BUN 21 01/22/2021 0912   CREATININE 0.92 01/22/2021 0912   CALCIUM 8.7 (L) 01/22/2021 0912   PROT 6.7 01/22/2021 0912   ALBUMIN 3.8 01/22/2021 0912   AST 19 01/22/2021 0912   ALT 16 01/22/2021 0912   ALKPHOS 64 01/22/2021 0912   BILITOT 3.2 (H) 01/22/2021 0912   GFRNONAA >60 01/22/2021 0912   Lab Results  Component Value Date   CHOL 112 01/22/2021   HDL 41 01/22/2021   LDLCALC 58 01/22/2021   LDLDIRECT 62.7 01/21/2021   TRIG 65 01/22/2021   CHOLHDL 2.7 01/22/2021  Lab Results  Component Value Date   HGBA1C 4.8 01/22/2021   No results found for: VITAMINB12 No results found for: TSH   01/22/21 TTE 1. Left ventricular ejection fraction, by estimation, is 60 to 65%. The  left ventricle has normal function. Left ventricular endocardial border  not optimally defined to evaluate regional wall motion. There is mild left  ventricular hypertrophy. Left  ventricular diastolic function could not be evaluated.   2. Right ventricular systolic function is normal. The right ventricular  size is normal.   3. Left atrial size was mildly dilated.   4. Right atrial size was moderately dilated.   5. The mitral valve is grossly normal. Trivial mitral valve  regurgitation. No evidence of mitral stenosis.   6. The aortic valve is tricuspid. There is mild thickening of the aortic  valve. Aortic valve regurgitation is not visualized. No aortic stenosis is  present.   7. Bubble study is non-diagnostic due to poor acoustic windows.    01/21/21 MRI brain / MRA head / neck 1. Faint area of restricted diffusion in the white matter of the posterior left frontal lobe  may represent hyperacute infarct. 2. No intracranial large vessel occlusion. 3. High-grade stenosis at the origin of a left M3/MCA middle division branch. 4. No hemodynamically significant stenosis in the neck. 5. 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    ASSESSMENT AND PLAN  81 y.o. year old male here with:   Dx:  1. Chronic ischemic left MCA stroke   2. Chronic atrial fibrillation (HCC)   3. Pure hypercholesterolemia     PLAN:  LEFT MCA STROKE (subtherapeutic INR; chronic atrial fibrillation) - consider changing warfarin to eliquis 5mg  twice a day (will discuss with PCP and cardiology) - continue statin and BP control - stroke education, signs and symptoms, prevention, nutrition education factors reviewed  Return for return to PCP.    Penni Bombard, MD 12/04/7589, 63:84 AM Certified in Neurology, Neurophysiology and Neuroimaging  [facility] [address] [place], [place] [address] [phone]

## 2022-10-22 NOTE — Progress Notes (Signed)
10/23/2022 11:44 AM   Annamarie Major [birth year] [id number]  Referring provider: Marisue Ivan, MD [phone] [facility] [address] [facility] [place],  [place] [address]  Urological history: 1. BPH with LU TS  Chief Complaint  Patient presents with   Hematuria    HPI: Hunter Klein is a 82 y.o. male who presents today for hematuria.   Previous records reviewed.   It looks like he reached out to his PCP on September 29, 2022 complaining of dark brown urine.  His UA was dark brown, turbid, specific gravity 1.018, pH 5.5, 1+ protein, 3+ heme, 1+ leuks, 8 WBCs, 182 RBCs and 0-5 bacteria.  Urine culture was negative.  He states his gross hematuria occurred the first thing in the morning.  He woke up that morning, went to urinate and passed dark reddish-brown urine without pain or clots.  He then went to see his PCP who instructed him to stop his warfarin and start an antibiotic.  He states the urine was clear by the next morning.  He has since resumed his warfarin and discontinue the antibiotic  Patient denies any modifying or aggravating factors.  Patient denies any recent UTI's, dysuria or suprapubic/flank pain.  Patient denies any fevers, chills, nausea or vomiting.   UA yellow clear, specific gravity 1.025, trace heme, pH 5.5, trace protein, 0-5 WBCs, 0-2 RBCs, 0-2 epithelial cells, mucus threads present and moderate bacteria.  PVR 0 mL   PMH: Past Medical History:  Diagnosis Date   Atrial fibrillation, chronic (HCC)    BPH (benign prostatic hyperplasia)    Dysrhythmia    Afib   Sullivan Lone syndrome    Renal insufficiency     Surgical History: Past Surgical History:  Procedure Laterality Date   COLONOSCOPY N/A 01/11/2020   Procedure: COLONOSCOPY;  Surgeon: Toledo, Boykin Nearing, MD;  Location: [facility] ENDOSCOPY;  Service: Gastroenterology;  Laterality: N/A;   TONSILLECTOMY      Home Medications:  Allergies as of 10/23/2022   No Known Allergies      Medication List         Accurate as of October 23, 2022 11:44 AM. If you have any questions, ask your nurse or doctor.          acetaminophen 500 MG tablet Commonly known as: TYLENOL Take by mouth.   enoxaparin 100 MG/ML injection Commonly known as: LOVENOX Inject 1 mL (100 mg total) into the skin every 12 (twelve) hours for 5 days.   latanoprost 0.005 % ophthalmic solution Commonly known as: XALATAN Place 1 drop into the right eye at bedtime.   lisinopril 2.5 MG tablet Commonly known as: ZESTRIL Take 2.5 mg by mouth at bedtime.   loratadine 10 MG tablet Commonly known as: CLARITIN Take by mouth.   metoprolol succinate 50 MG 24 hr tablet Commonly known as: TOPROL-XL Take 50 mg by mouth daily.   simvastatin 40 MG tablet Commonly known as: ZOCOR Take 40 mg by mouth at bedtime.   warfarin 3 MG tablet Commonly known as: COUMADIN Take 1 tablet (3 mg total) by mouth daily. Pt taking 2 mg tuesdays and thursdays. Taking 3 mg mondays, wednesdays, fridays, saturdays, and sundays What changed: additional instructions        Allergies: No Known Allergies  Family History: No family history on file.  Social History:  reports that he has never smoked. He has never used smokeless tobacco. He reports that he does not currently use alcohol. He reports that he does not use drugs.  ROS: Pertinent ROS in HPI  Physical Exam: BP 127/79   Pulse (!) 56   Wt 225 lb (102.1 kg)   BMI 32.28 kg/m   Constitutional:  Well nourished. Alert and oriented, No acute distress. HEENT: [place] AT, moist mucus membranes.  Trachea midline Cardiovascular: No clubbing, cyanosis, or edema. Respiratory: Normal respiratory effort, no increased work of breathing. Neurologic: Grossly intact, no focal deficits, moving all 4 extremities. Psychiatric: Normal mood and affect.  Laboratory Data: Urinalysis Results for orders placed or performed in visit on 10/23/22  Microscopic Examination   Urine  Result Value Ref Range   WBC,  UA 0-5 0 - 5 /hpf   RBC, Urine 0-2 0 - 2 /hpf   Epithelial Cells (non renal) 0-10 0 - 10 /hpf   Bacteria, UA Present (A) None seen/Few   Yeast, UA Moderate (A) None seen  Urinalysis, Complete  Result Value Ref Range   Specific Gravity, UA 1.025 1.005 - 1.030   pH, UA 5.5 5.0 - 7.5   Color, UA Yellow Yellow   Appearance Ur Clear Clear   Leukocytes,UA Negative Negative   Protein,UA Trace (A) Negative/Trace   Glucose, UA Negative Negative   Ketones, UA Negative Negative   RBC, UA Trace (A) Negative   Bilirubin, UA Negative Negative   Urobilinogen, Ur 1.0 0.2 - 1.0 mg/dL   Nitrite, UA Negative Negative   Microscopic Examination See below:   Bladder Scan (Post Void Residual) in office  Result Value Ref Range   Scan Result 0ml      I have reviewed the labs.   Pertinent Imaging:  10/23/22 11:21  Scan Result 0ml    Assessment & Plan:    1. Gross hematuria - we discussed that there are a number of causes that can be associated with blood in the urine, such as stones, BPH, UTI's, damage to the urinary tract and/or cancer.   Sometimes, we do not find a cause or source of the hematuria.   -we discussed that new guidelines place individuals into risk categories of low, intermediate and high risk categories.  These factors are based on age, smoking history and degree of blood in urine.   -we discussed that at this time, they are in the high risk stratification  -we discussed that the recommended protocol for further work up are are CT urogram and cysto  -we discussed that for a CT urogram a contrast material will be injected into a vein and that in rare instances, an allergic reaction can result and may even life threatening (1:100,000)  The patient denies any allergies to contrast, iodine and/or seafood and is not taking metformin. - we discussed that following the imaging study,  a cystoscopy is performed  -we discussed that a cystoscopy is a procedure that consists of passing a camera  up their urethra after administering lidocaine to anesthetize and that after the procedure a minor amount of blood in the urine and/or burning which usually resolves in 24 to 48 hours may occur  -the patient had the opportunity to ask questions which were answered. Based upon this discussion, the patient is willing to proceed. Therefore, I've ordered: a CT Urogram and cystoscopy  - The patient will return following all of the above for discussion of the results.     Return for CT Urogram report and cystoscopy for gross heme .  These notes generated with voice recognition software. I apologize for typographical errors.  Cloretta Ned  [facility]  [address]  [address] [place], [place] [address] [phone]

## 2022-10-23 ENCOUNTER — Other Ambulatory Visit: Payer: Self-pay

## 2022-10-23 ENCOUNTER — Encounter: Payer: Self-pay | Admitting: Urology

## 2022-10-23 ENCOUNTER — Ambulatory Visit (INDEPENDENT_AMBULATORY_CARE_PROVIDER_SITE_OTHER): Payer: Medicare Other | Admitting: Urology

## 2022-10-23 VITALS — BP 127/79 | HR 56 | Wt 225.0 lb

## 2022-10-23 DIAGNOSIS — R310 Gross hematuria: Secondary | ICD-10-CM | POA: Diagnosis not present

## 2022-10-23 LAB — URINALYSIS, COMPLETE
Bilirubin, UA: NEGATIVE
Glucose, UA: NEGATIVE
Ketones, UA: NEGATIVE
Leukocytes,UA: NEGATIVE
Nitrite, UA: NEGATIVE
Specific Gravity, UA: 1.025 (ref 1.005–1.030)
Urobilinogen, Ur: 1 mg/dL (ref 0.2–1.0)
pH, UA: 5.5 (ref 5.0–7.5)

## 2022-10-23 LAB — BLADDER SCAN AMB NON-IMAGING

## 2022-10-23 LAB — MICROSCOPIC EXAMINATION

## 2022-11-04 ENCOUNTER — Ambulatory Visit
Admission: RE | Admit: 2022-11-04 | Discharge: 2022-11-04 | Disposition: A | Discharge status: Home / Self Care | Payer: Medicare Other | Source: Ambulatory Visit | Attending: Urology | Admitting: Urology

## 2022-11-04 DIAGNOSIS — R310 Gross hematuria: Secondary | ICD-10-CM | POA: Insufficient documentation

## 2022-11-04 LAB — POCT I-STAT CREATININE: Creatinine, Ser: 1.3 mg/dL — ABNORMAL HIGH (ref 0.61–1.24)

## 2022-11-04 MED ORDER — IOHEXOL 300 MG/ML  SOLN
100.0000 mL | Freq: Once | INTRAMUSCULAR | Status: AC | PRN
  Administered 2022-11-04: 100 mL via INTRAVENOUS

## 2022-11-05 ENCOUNTER — Ambulatory Visit (INDEPENDENT_AMBULATORY_CARE_PROVIDER_SITE_OTHER): Payer: Medicare Other | Admitting: Urology

## 2022-11-05 ENCOUNTER — Encounter: Payer: Self-pay | Admitting: Urology

## 2022-11-05 VITALS — BP 137/76 | HR 86 | Ht 70.0 in | Wt 224.0 lb

## 2022-11-05 DIAGNOSIS — R310 Gross hematuria: Secondary | ICD-10-CM

## 2022-11-05 MED ORDER — CEPHALEXIN 250 MG PO CAPS
500.0000 mg | ORAL_CAPSULE | Freq: Once | ORAL | Status: AC
Start: 2022-11-05 — End: 2022-11-05
  Administered 2022-11-05: 500 mg via ORAL

## 2022-11-05 NOTE — Progress Notes (Signed)
Cystoscopy Procedure Note:  Indication: Gross hematuria  Keflex given for prophylaxis  After informed consent and discussion of the procedure and its risks, Hunter Klein was positioned and prepped in the standard fashion. Cystoscopy was performed with a flexible cystoscope. The urethra, bladder neck and entire bladder was visualized in a standard fashion. The prostate was moderate in size. The ureteral orifices were visualized in their normal location and orientation.  Bladder mucosa normal throughout, small diverticulum near the dome.  No abnormalities on retroflexion.  Cytology sent  Imaging: CT report pending, I personally reviewed and interpreted the images that showed no obvious etiology of gross hematuria, no filling defects, distal ureters not opacified  Findings: Normal cystoscopy  Assessment and Plan: Will contact with final CT report and cytology results Consider resuming finasteride in the future if recurrent gross hematuria  Legrand Rams, MD 11/05/2022

## 2022-11-12 DIAGNOSIS — R310 Gross hematuria: Secondary | ICD-10-CM

## 2022-11-12 DIAGNOSIS — N289 Disorder of kidney and ureter, unspecified: Secondary | ICD-10-CM

## 2022-11-12 NOTE — Telephone Encounter (Signed)
Renal scan ordered. Appt scheduled.

## 2023-03-12 IMAGING — MR MR HEAD W/O CM
15 series · 48 of 48 positions shown · non-contrast
Comparison: Head CT January 21, 2021.

CLINICAL DATA: Neuro deficit, acute, stroke suspected.

EXAM:
MRI HEAD WITHOUT CONTRAST
MRA HEAD WITHOUT CONTRAST
MRA NECK WITHOUT CONTRAST
TECHNIQUE: Multiplanar, multi-echo pulse sequences of the brain and surrounding
structures were acquired without intravenous contrast. Angiographic
images of the Circle of Willis were acquired using MRA technique
without intravenous contrast. Angiographic images of the neck were
acquired using MRA technique without intravenous contrast. Carotid
stenosis measurements (when applicable) are obtained utilizing
NASCET criteria, using the distal internal carotid diameter as the
denominator.

[Series 5: ax dwi_tracew · axial · 3.0mm · 1.80mm/px · z∈[-18,+136]mm · 5 of 48 slices shown]
[im 1/48]
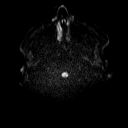
[im 12/48]
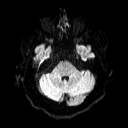
[im 24/48]
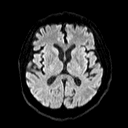
[im 36/48]
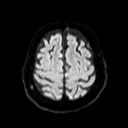
[im 48/48]
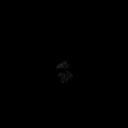

[Series 6: ax dwi_adc · axial · 3.0mm · 1.80mm/px · z∈[-18,+136]mm · 4 of 48 slices shown]
[im 1/48]
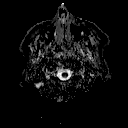
[im 16/48]
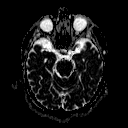
[im 32/48]
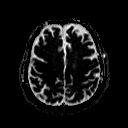
[im 48/48]
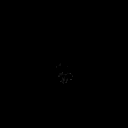

[Series 7: cor dwi_tracew · coronal · 5.0mm · 1.80mm/px · 3 of 38 slices shown]
[im 1/38]
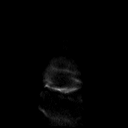
[im 19/38]
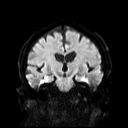
[im 38/38]
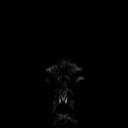

[Series 8: cor dwi_adc · coronal · 5.0mm · 1.80mm/px · 3 of 38 slices shown]
[im 1/38]
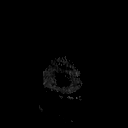
[im 19/38]
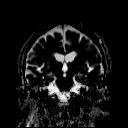
[im 38/38]
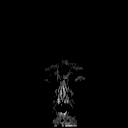

[Series 9: FLAIR · axial · 3.0mm · 0.69mm/px · z∈[-22,+136]mm · 4 of 54 slices shown (1 of 2)]
[im 1/54]
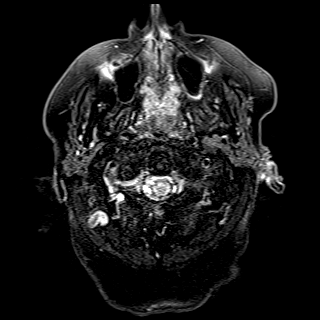
[im 18/54]
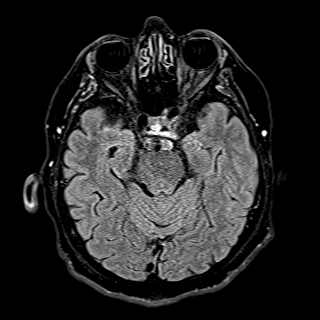
[im 36/54]
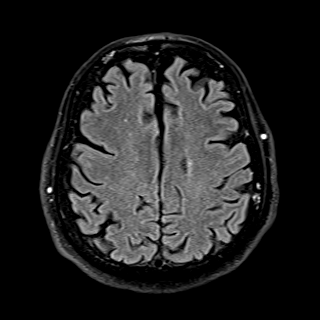
[im 54/54]
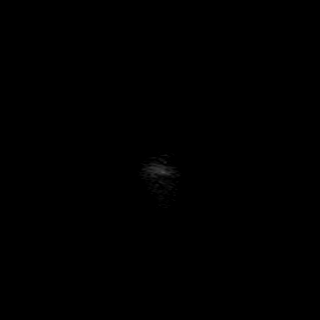

[Series 10: mag_images · axial · 3.0mm · 0.90mm/px · z∈[-17,+135]mm · 4 of 52 slices shown]
[im 1/52]
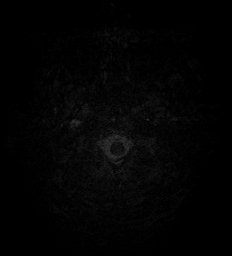
[im 18/52]
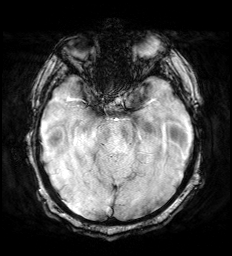
[im 35/52]
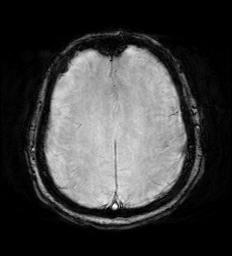
[im 52/52]
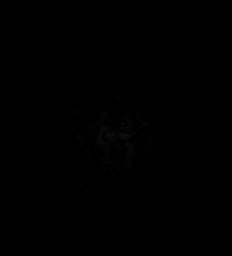

[Series 11: pha_images · axial · 3.0mm · 0.90mm/px · z∈[-14,+135]mm · 4 of 51 slices shown]
[im 1/51]
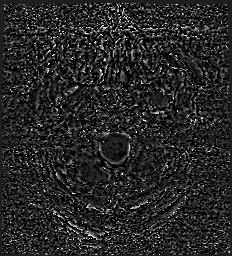
[im 17/51]
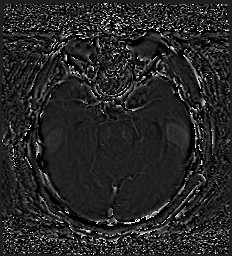
[im 34/51]
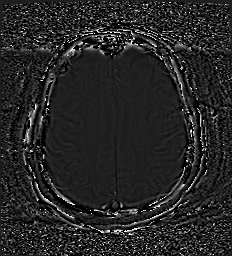
[im 51/51]
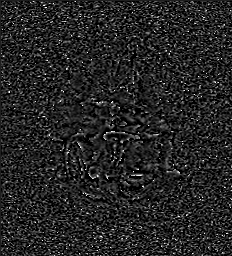

[Series 12: swi_images · axial · 3.0mm · 0.90mm/px · z∈[-17,+135]mm · 4 of 52 slices shown]
[im 1/52]
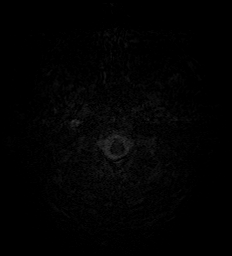
[im 18/52]
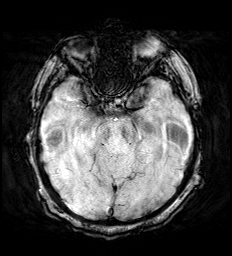
[im 35/52]
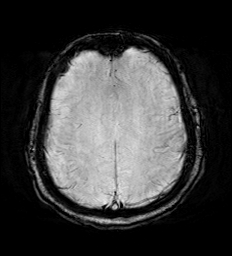
[im 52/52]
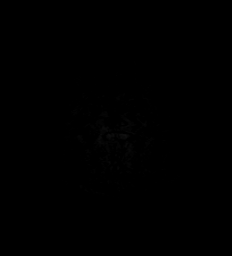

[Series 13: mip_images(sw) · axial · 24.0mm · 0.90mm/px · z∈[-6,+125]mm · 4 of 45 slices shown]
[im 1/45]
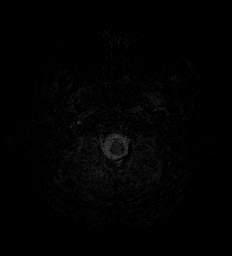
[im 15/45]
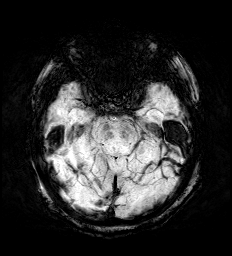
[im 30/45]
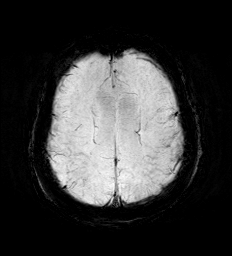
[im 45/45]
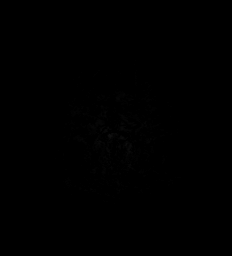

[Series 14: T1 · sagittal · 5.0mm · 0.94mm/px · 2 of 23 slices shown (1 of 2)]
[im 1/23]
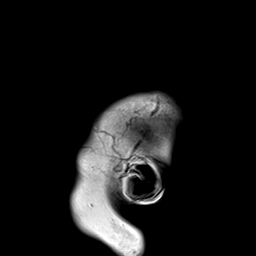
[im 23/23]
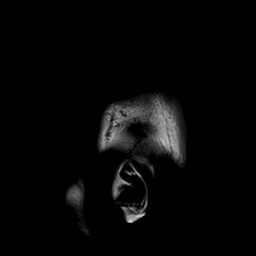

[Series 15: T2 · axial · 5.0mm · 0.45mm/px · z∈[-23,+132]mm · 2 of 27 slices shown (1 of 2)]
[im 1/27]
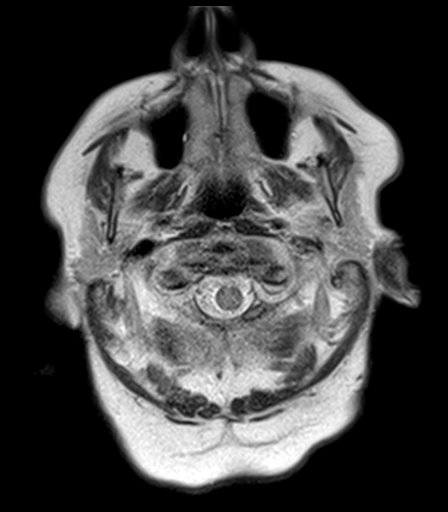
[im 27/27]
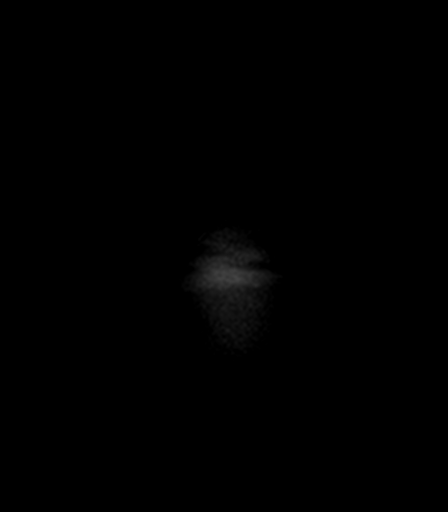

[Series 16: T2-star · axial · 5.0mm · 0.45mm/px · z∈[-23,+132]mm · 2 of 27 slices shown]
[im 1/27]
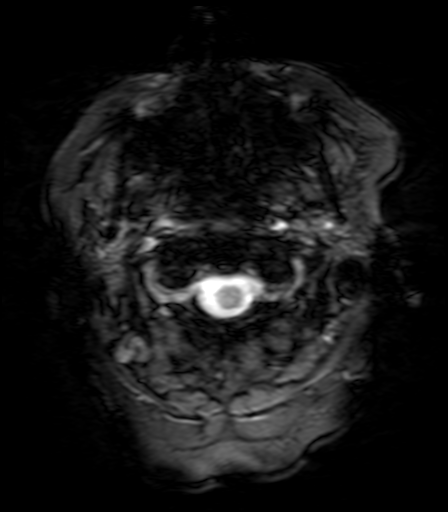
[im 27/27]
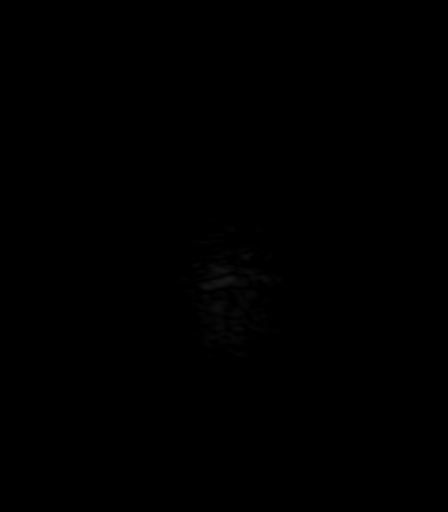

[Series 17: FLAIR · axial · 5.0mm · 1.20mm/px · z∈[-24,+131]mm · 2 of 27 slices shown (2 of 2)]
[im 1/27]
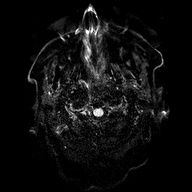
[im 27/27]
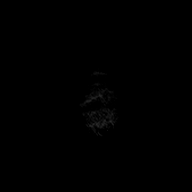

[Series 18: T1 · axial · 5.0mm · 0.90mm/px · z∈[-23,+132]mm · 2 of 27 slices shown (2 of 2)]
[im 1/27]
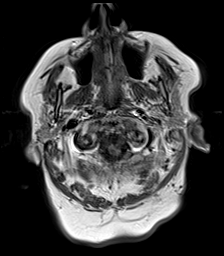
[im 27/27]
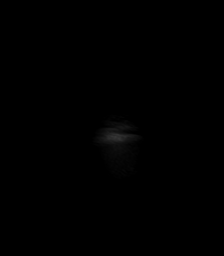

[Series 19: T2 · coronal · 5.0mm · 0.45mm/px · 3 of 31 slices shown (2 of 2)]
[im 1/31]
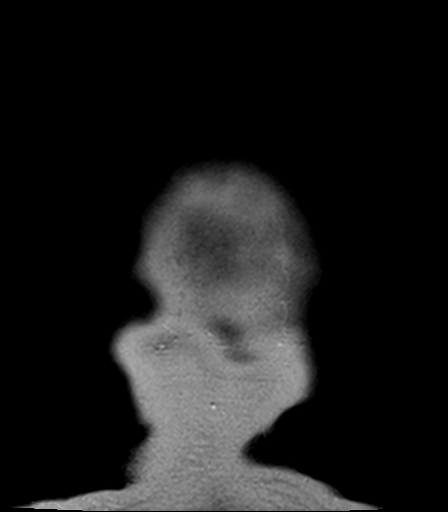
[im 16/31]
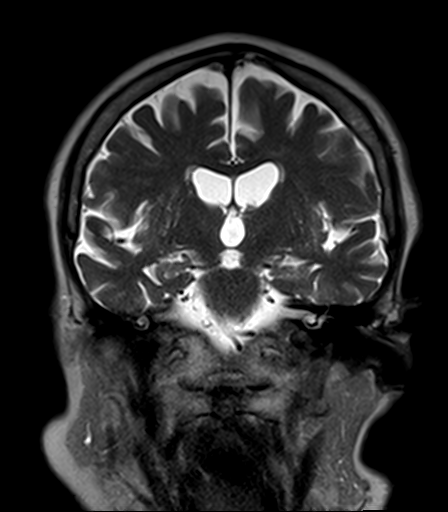
[im 31/31]
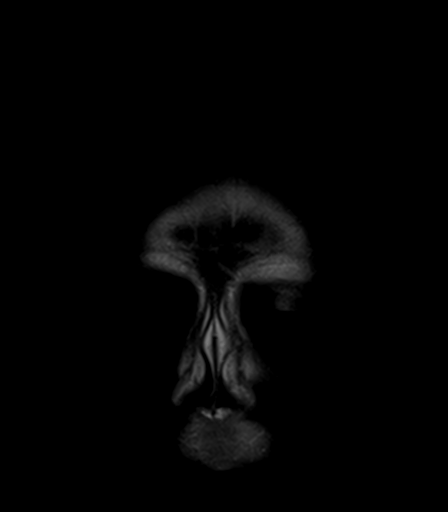

[48 of 48 positions shown; findings below may reference images not displayed]

FINDINGS: MRI HEAD FINDINGS

Brain: Focal area of restricted diffusion within the white matter of
the posterior left frontal lobe without corresponding T2
hyperintensity. No hemorrhage, hydrocephalus, extra-axial collection
or mass lesion. Small amount of scattered foci of T2 hyperintensity
are seen within the white matter of the cerebral hemispheres and
within the pons, nonspecific, most likely related to chronic small
vessel ischemia.

Vascular: Normal flow voids.

Skull and upper cervical spine: Normal marrow signal.

Sinuses/Orbits: Bilateral lens surgery. Paranasal sinuses are clear.

Other: None.

MRA HEAD FINDINGS

The study is partially degraded by motion.

Anterior circulation: Normal flow related enhancement of the
intracranial carotid arteries. A 3 mm medially projecting
outpouching from the cavernous segment of the right ICA, consistent
with small aneurysm. High-grade stenosis is seen at the origin of a
left M3/MCA middle division branch, suggestive of intracranial
atherosclerotic disease. No large vessel occlusion identified in the
anterior circulation. Normal flow related enhancement in the right
MCA and bilateral ACA vascular tree.

Posterior circulation: Normal flow related enhancement of the
intracranial vertebral arteries, basilar artery and bilateral
posterior cerebral arteries in, noting origin of the right posterior
cerebral artery directly from the right ICA (fetal PCA).

Anatomic variants: Right fetal PCA.

MRA NECK FINDINGS

Normal course and caliber of the visualized portions of the
bilateral common carotid arteries, carotid bifurcation and cervical
internal carotid arteries. No hemodynamically significant stenosis
seen.

Normal caliber and flow related enhancement of the visualized V2
segment of the bilateral vertebral arteries.
IMPRESSION: 1. Faint area of restricted diffusion in the white matter of the
posterior left frontal lobe may represent hyperacute infarct.
2. No intracranial large vessel occlusion.
3. High-grade stenosis at the origin of a left M3/MCA middle
division branch.
4. No hemodynamically significant stenosis in the neck.
5. Mild chronic microvascular ischemic changes of the white matter.

These results were called by telephone at the time of interpretation
on 01/21/2021 at [DATE] to provider [REDACTED], SOE , who
verbally acknowledged these results.

## 2023-03-12 IMAGING — MR MR MRA HEAD W/O CM
1 series · 18 of 48 positions shown · non-contrast
Comparison: Head CT January 21, 2021.

CLINICAL DATA: Neuro deficit, acute, stroke suspected.

EXAM:
MRI HEAD WITHOUT CONTRAST
MRA HEAD WITHOUT CONTRAST
MRA NECK WITHOUT CONTRAST
TECHNIQUE: Multiplanar, multi-echo pulse sequences of the brain and surrounding
structures were acquired without intravenous contrast. Angiographic
images of the Circle of Willis were acquired using MRA technique
without intravenous contrast. Angiographic images of the neck were
acquired using MRA technique without intravenous contrast. Carotid
stenosis measurements (when applicable) are obtained utilizing
NASCET criteria, using the distal internal carotid diameter as the
denominator.

[Series 7: TOF · axial · 0.5mm · 0.41mm/px · z∈[-24,+73]mm · 18 of 205 slices shown]
[im 1/205]
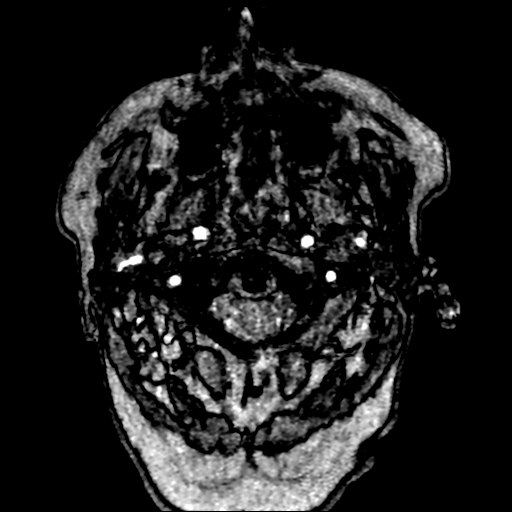
[im 5/205]
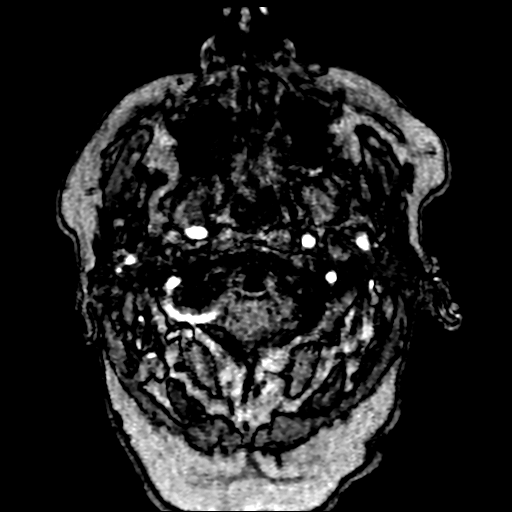
[im 9/205]
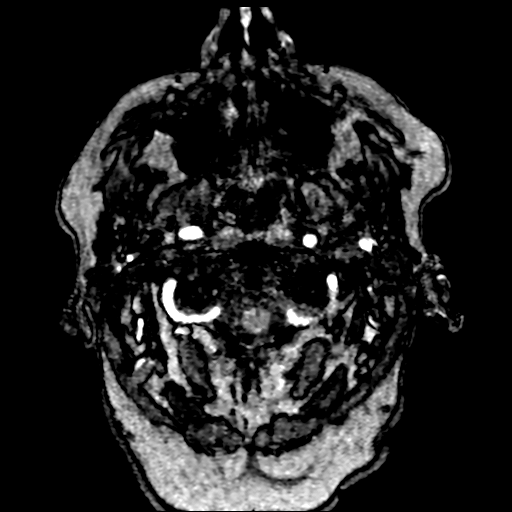
[im 14/205]
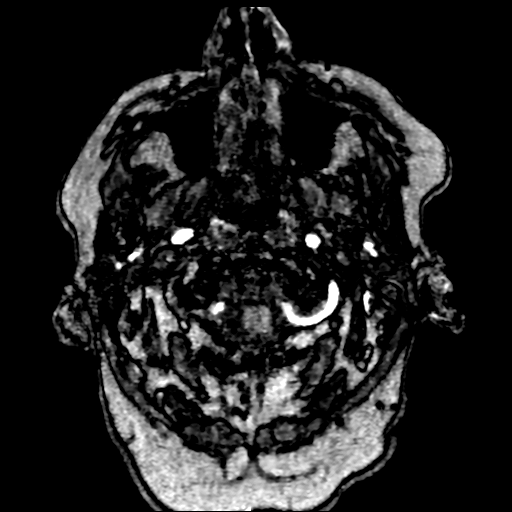
[im 18/205]
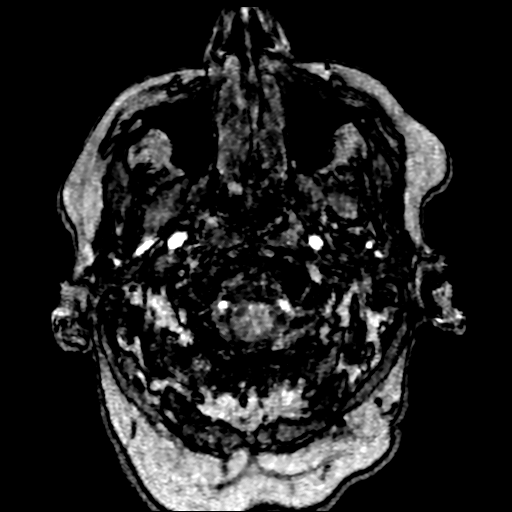
[im 22/205]
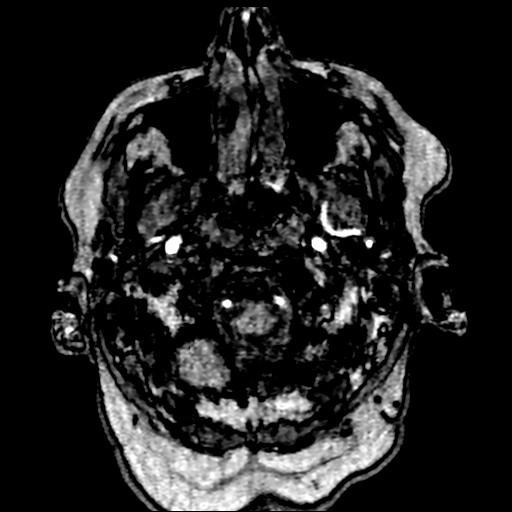
[im 27/205]
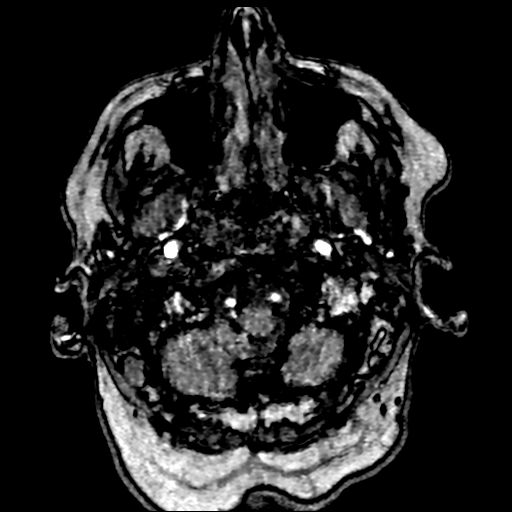
[im 31/205]
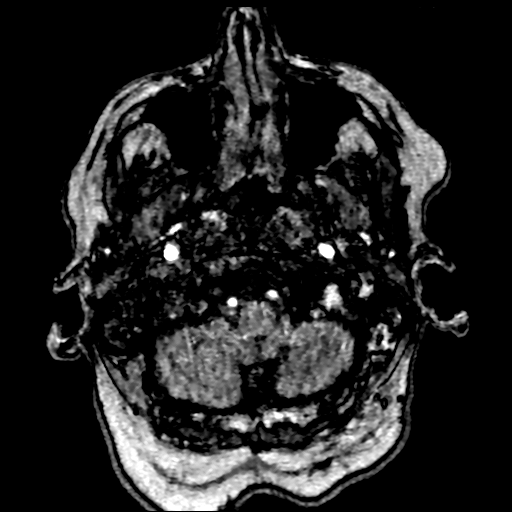
[im 35/205]
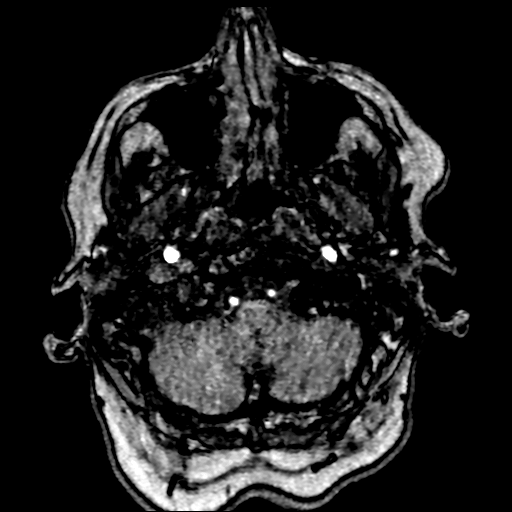
[im 40/205]
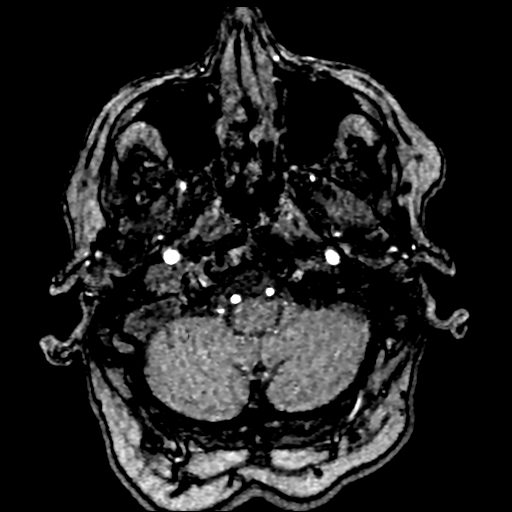
[im 66/205]
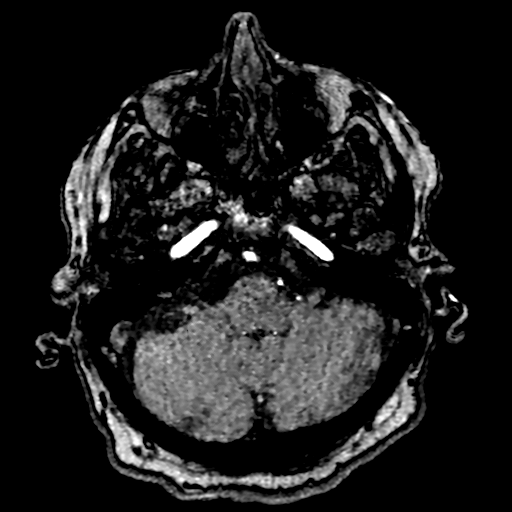
[im 92/205]
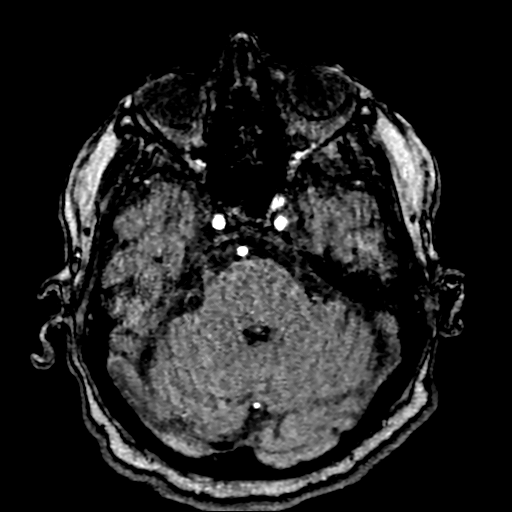
[im 105/205]
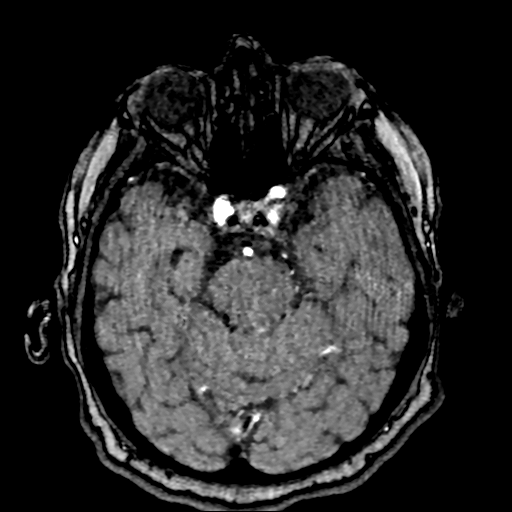
[im 118/205]
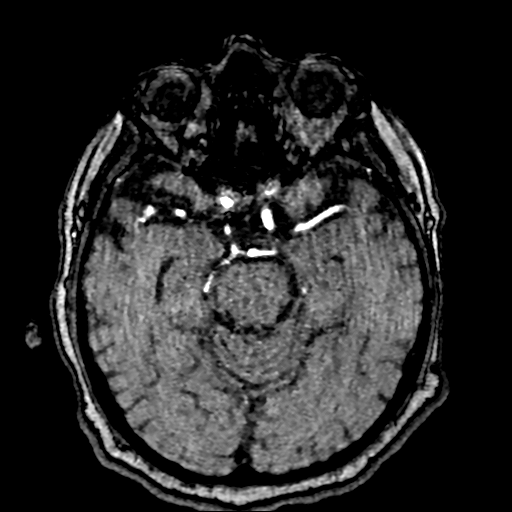
[im 144/205]
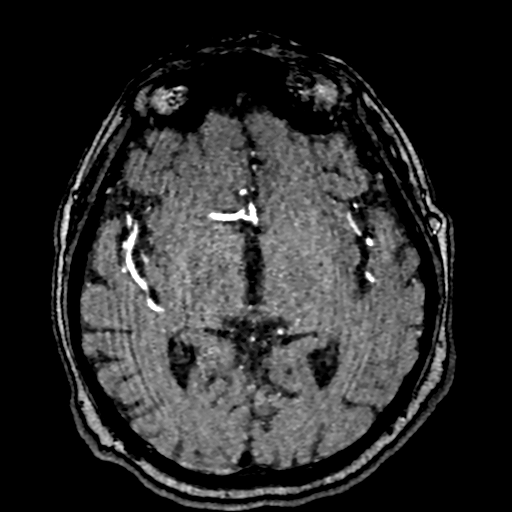
[im 170/205]
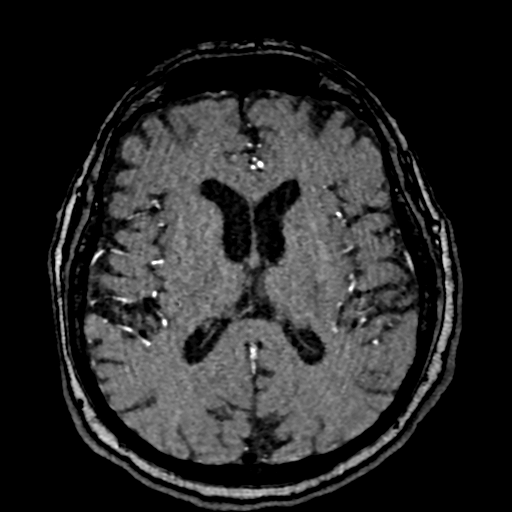
[im 174/205]
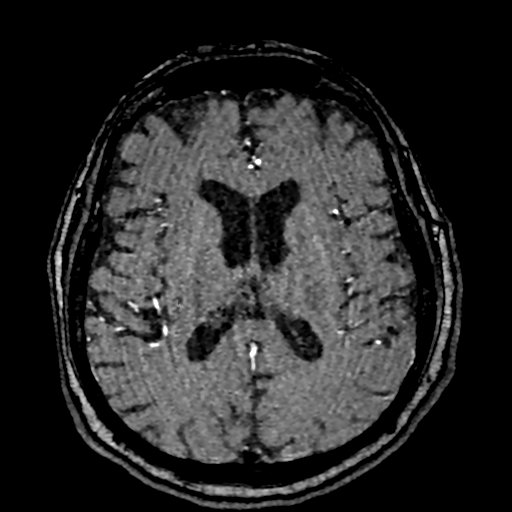
[im 196/205]
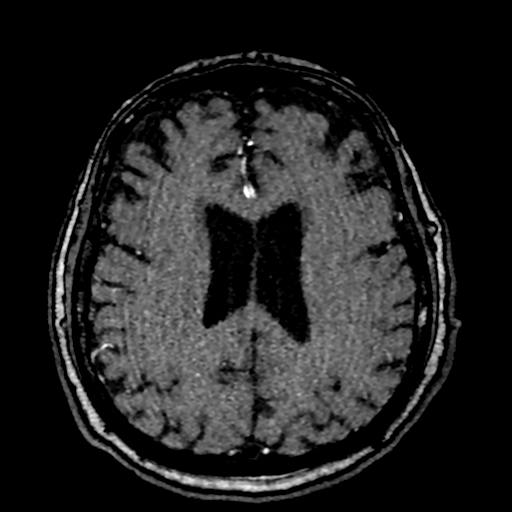

[18 of 48 positions shown; findings below may reference images not displayed]

FINDINGS: MRI HEAD FINDINGS

Brain: Focal area of restricted diffusion within the white matter of
the posterior left frontal lobe without corresponding T2
hyperintensity. No hemorrhage, hydrocephalus, extra-axial collection
or mass lesion. Small amount of scattered foci of T2 hyperintensity
are seen within the white matter of the cerebral hemispheres and
within the pons, nonspecific, most likely related to chronic small
vessel ischemia.

Vascular: Normal flow voids.

Skull and upper cervical spine: Normal marrow signal.

Sinuses/Orbits: Bilateral lens surgery. Paranasal sinuses are clear.

Other: None.

MRA HEAD FINDINGS

The study is partially degraded by motion.

Anterior circulation: Normal flow related enhancement of the
intracranial carotid arteries. A 3 mm medially projecting
outpouching from the cavernous segment of the right ICA, consistent
with small aneurysm. High-grade stenosis is seen at the origin of a
left M3/MCA middle division branch, suggestive of intracranial
atherosclerotic disease. No large vessel occlusion identified in the
anterior circulation. Normal flow related enhancement in the right
MCA and bilateral ACA vascular tree.

Posterior circulation: Normal flow related enhancement of the
intracranial vertebral arteries, basilar artery and bilateral
posterior cerebral arteries in, noting origin of the right posterior
cerebral artery directly from the right ICA (fetal PCA).

Anatomic variants: Right fetal PCA.

MRA NECK FINDINGS

Normal course and caliber of the visualized portions of the
bilateral common carotid arteries, carotid bifurcation and cervical
internal carotid arteries. No hemodynamically significant stenosis
seen.

Normal caliber and flow related enhancement of the visualized V2
segment of the bilateral vertebral arteries.
IMPRESSION: 1. Faint area of restricted diffusion in the white matter of the
posterior left frontal lobe may represent hyperacute infarct.
2. No intracranial large vessel occlusion.
3. High-grade stenosis at the origin of a left M3/MCA middle
division branch.
4. No hemodynamically significant stenosis in the neck.
5. Mild chronic microvascular ischemic changes of the white matter.

These results were called by telephone at the time of interpretation
on 01/21/2021 at [DATE] to provider [REDACTED], SOE , who
verbally acknowledged these results.

## 2023-05-19 ENCOUNTER — Ambulatory Visit: Payer: Self-pay | Admitting: Urology

## 2023-05-27 ENCOUNTER — Ambulatory Visit: Payer: Medicare Other | Admitting: Urology

## 2023-05-27 ENCOUNTER — Ambulatory Visit
Admission: RE | Admit: 2023-05-27 | Discharge: 2023-05-27 | Disposition: A | Discharge status: Home / Self Care | Payer: Medicare Other | Source: Ambulatory Visit | Attending: Urology | Admitting: Urology

## 2023-05-27 DIAGNOSIS — N289 Disorder of kidney and ureter, unspecified: Secondary | ICD-10-CM | POA: Insufficient documentation

## 2023-05-27 DIAGNOSIS — R310 Gross hematuria: Secondary | ICD-10-CM | POA: Insufficient documentation

## 2023-06-10 ENCOUNTER — Encounter: Payer: Self-pay | Admitting: Urology

## 2023-06-10 ENCOUNTER — Ambulatory Visit (INDEPENDENT_AMBULATORY_CARE_PROVIDER_SITE_OTHER): Payer: Medicare Other | Admitting: Urology

## 2023-06-10 VITALS — BP 146/88 | HR 70 | Ht 70.0 in | Wt 225.0 lb

## 2023-06-10 DIAGNOSIS — N2889 Other specified disorders of kidney and ureter: Secondary | ICD-10-CM

## 2023-06-10 DIAGNOSIS — N289 Disorder of kidney and ureter, unspecified: Secondary | ICD-10-CM | POA: Diagnosis not present

## 2023-06-10 NOTE — Progress Notes (Signed)
   06/10/2023 3:20 PM   Markevious Ehmke 08-Dec-1940 [id number]  Reason for visit: Follow up small right renal lesion  HPI: 83 year old male who moved to the area from [place] in May 2021.  He was taking finasteride at that time but discontinued that medication and did not notice any worsening of his urinary symptoms.  He had an episode of gross hematuria in July 2024 that was evaluated with CT and cystoscopy that was essentially benign.  There was an incidental 1 cm right lower pole lesion incompletely evaluated based on size and follow-up imaging was recommended.  I personally viewed and interpreted the follow-up renal ultrasound from March 2025 that does not identify any suspicious lesions.  We discussed this is likely a benign process, and even if malignant can be monitored with surveillance with his age.  I recommended a repeat CT in 1 year and if stable likely can discontinue surveillance.  RTC 1 year CT prior for further evaluation of small right renal lesion  Sondra Come, MD  [facility] [address] [place], [place] [address] [phone]

## 2024-01-06 ENCOUNTER — Encounter: Payer: Self-pay | Admitting: Family Medicine

## 2024-01-06 DIAGNOSIS — R42 Dizziness and giddiness: Secondary | ICD-10-CM

## 2024-01-06 DIAGNOSIS — R2681 Unsteadiness on feet: Secondary | ICD-10-CM

## 2024-01-07 ENCOUNTER — Other Ambulatory Visit: Payer: Self-pay | Admitting: Family Medicine

## 2024-01-07 DIAGNOSIS — R42 Dizziness and giddiness: Secondary | ICD-10-CM

## 2024-01-07 DIAGNOSIS — H532 Diplopia: Secondary | ICD-10-CM

## 2024-01-07 DIAGNOSIS — R251 Tremor, unspecified: Secondary | ICD-10-CM

## 2024-01-07 DIAGNOSIS — Z8673 Personal history of transient ischemic attack (TIA), and cerebral infarction without residual deficits: Secondary | ICD-10-CM

## 2024-01-07 DIAGNOSIS — R2681 Unsteadiness on feet: Secondary | ICD-10-CM

## 2024-01-11 ENCOUNTER — Ambulatory Visit
Admission: RE | Admit: 2024-01-11 | Discharge: 2024-01-11 | Disposition: A | Discharge status: Home / Self Care | Source: Ambulatory Visit | Attending: Family Medicine | Admitting: Family Medicine

## 2024-01-11 DIAGNOSIS — H532 Diplopia: Secondary | ICD-10-CM | POA: Diagnosis present

## 2024-01-11 DIAGNOSIS — R251 Tremor, unspecified: Secondary | ICD-10-CM

## 2024-01-11 DIAGNOSIS — R2681 Unsteadiness on feet: Secondary | ICD-10-CM | POA: Diagnosis present

## 2024-01-11 DIAGNOSIS — R42 Dizziness and giddiness: Secondary | ICD-10-CM | POA: Diagnosis present

## 2024-01-11 DIAGNOSIS — Z8673 Personal history of transient ischemic attack (TIA), and cerebral infarction without residual deficits: Secondary | ICD-10-CM

## 2024-01-11 MED ORDER — GADOBUTROL 1 MMOL/ML IV SOLN
10.0000 mL | Freq: Once | INTRAVENOUS | Status: AC | PRN
  Administered 2024-01-11: 10 mL via INTRAVENOUS

## 2024-03-03 ENCOUNTER — Other Ambulatory Visit: Payer: Self-pay | Admitting: Physician Assistant

## 2024-03-03 DIAGNOSIS — H539 Unspecified visual disturbance: Secondary | ICD-10-CM

## 2024-03-03 DIAGNOSIS — Z8673 Personal history of transient ischemic attack (TIA), and cerebral infarction without residual deficits: Secondary | ICD-10-CM

## 2024-03-03 DIAGNOSIS — R2689 Other abnormalities of gait and mobility: Secondary | ICD-10-CM

## 2024-03-03 DIAGNOSIS — R42 Dizziness and giddiness: Secondary | ICD-10-CM

## 2024-03-03 DIAGNOSIS — R200 Anesthesia of skin: Secondary | ICD-10-CM

## 2024-03-08 ENCOUNTER — Ambulatory Visit
Admission: RE | Admit: 2024-03-08 | Discharge: 2024-03-08 | Attending: Physician Assistant | Admitting: Physician Assistant

## 2024-03-08 DIAGNOSIS — R42 Dizziness and giddiness: Secondary | ICD-10-CM

## 2024-03-08 DIAGNOSIS — R200 Anesthesia of skin: Secondary | ICD-10-CM

## 2024-03-08 DIAGNOSIS — Z8673 Personal history of transient ischemic attack (TIA), and cerebral infarction without residual deficits: Secondary | ICD-10-CM

## 2024-03-08 DIAGNOSIS — R2689 Other abnormalities of gait and mobility: Secondary | ICD-10-CM

## 2024-03-08 DIAGNOSIS — H539 Unspecified visual disturbance: Secondary | ICD-10-CM

## 2024-06-14 ENCOUNTER — Ambulatory Visit: Admitting: Urology
